# Patient Record
Sex: Male | Born: 1995 | Race: White | Hispanic: No | Marital: Married | Smoking: Never smoker
Health system: Southern US, Community
[De-identification: ages and names within clinical notes are randomized; demographics above are authoritative.]

## PROBLEM LIST (undated history)

## (undated) DIAGNOSIS — F988 Other specified behavioral and emotional disorders with onset usually occurring in childhood and adolescence: Secondary | ICD-10-CM

## (undated) DIAGNOSIS — R519 Headache, unspecified: Secondary | ICD-10-CM

## (undated) DIAGNOSIS — L409 Psoriasis, unspecified: Secondary | ICD-10-CM

## (undated) DIAGNOSIS — R51 Headache: Secondary | ICD-10-CM

## (undated) DIAGNOSIS — G4733 Obstructive sleep apnea (adult) (pediatric): Secondary | ICD-10-CM

## (undated) DIAGNOSIS — E782 Mixed hyperlipidemia: Secondary | ICD-10-CM

## (undated) DIAGNOSIS — R32 Unspecified urinary incontinence: Secondary | ICD-10-CM

## (undated) DIAGNOSIS — G8929 Other chronic pain: Secondary | ICD-10-CM

## (undated) HISTORY — DX: Obstructive sleep apnea (adult) (pediatric): G47.33

## (undated) HISTORY — DX: Other specified behavioral and emotional disorders with onset usually occurring in childhood and adolescence: F98.8

## (undated) HISTORY — DX: Unspecified urinary incontinence: R32

## (undated) HISTORY — DX: Psoriasis, unspecified: L40.9

## (undated) HISTORY — DX: Mixed hyperlipidemia: E78.2

---
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
## 2013-01-12 ENCOUNTER — Emergency Department (HOSPITAL_COMMUNITY): Payer: 59

## 2013-01-12 ENCOUNTER — Emergency Department (HOSPITAL_COMMUNITY)
Admission: EM | Admit: 2013-01-12 | Discharge: 2013-01-12 | Disposition: A | Discharge status: Home / Self Care | Payer: 59 | Attending: Emergency Medicine | Admitting: Emergency Medicine

## 2013-01-12 ENCOUNTER — Encounter (HOSPITAL_COMMUNITY): Payer: Self-pay | Admitting: *Deleted

## 2013-01-12 DIAGNOSIS — R1032 Left lower quadrant pain: Secondary | ICD-10-CM | POA: Insufficient documentation

## 2013-01-12 DIAGNOSIS — R112 Nausea with vomiting, unspecified: Secondary | ICD-10-CM | POA: Insufficient documentation

## 2013-01-12 DIAGNOSIS — R197 Diarrhea, unspecified: Secondary | ICD-10-CM | POA: Insufficient documentation

## 2013-01-12 DIAGNOSIS — R51 Headache: Secondary | ICD-10-CM | POA: Insufficient documentation

## 2013-01-12 LAB — COMPREHENSIVE METABOLIC PANEL
ALT: 50 U/L (ref 0–53)
AST: 25 U/L (ref 0–37)
Albumin: 4.3 g/dL (ref 3.5–5.2)
Alkaline Phosphatase: 69 U/L (ref 39–117)
BUN: 17 mg/dL (ref 6–23)
CO2: 29 mEq/L (ref 19–32)
Calcium: 9.8 mg/dL (ref 8.4–10.5)
Chloride: 100 mEq/L (ref 96–112)
Creatinine, Ser: 0.83 mg/dL (ref 0.50–1.35)
GFR calc Af Amer: >90 mL/min (ref >90)
GFR calc non Af Amer: >90 mL/min (ref >90)
Glucose, Bld: 95 mg/dL (ref 70–99)
Potassium: 4.4 mEq/L (ref 3.5–5.1)
Sodium: 138 mEq/L (ref 135–145)
Total Bilirubin: 1.6 mg/dL — ABNORMAL HIGH (ref 0.3–1.2)
Total Protein: 8 g/dL (ref 6.0–8.3)

## 2013-01-12 LAB — URINALYSIS, ROUTINE W REFLEX MICROSCOPIC
Bilirubin Urine: NEGATIVE
Glucose, UA: NEGATIVE mg/dL
Hgb urine dipstick: NEGATIVE
Ketones, ur: NEGATIVE mg/dL
Leukocytes, UA: NEGATIVE
Nitrite: NEGATIVE
Protein, ur: NEGATIVE mg/dL
Specific Gravity, Urine: 1.025 (ref 1.005–1.030)
Urobilinogen, UA: 0.2 mg/dL (ref 0.0–1.0)
pH: 6 (ref 5.0–8.0)

## 2013-01-12 LAB — CBC WITH DIFFERENTIAL/PLATELET
Basophils Absolute: 0 10*3/uL (ref 0.0–0.1)
Basophils Relative: 0 % (ref 0–1)
Eosinophils Absolute: 0.1 10*3/uL (ref 0.0–0.7)
Eosinophils Relative: 1 % (ref 0–5)
HCT: 41.7 % (ref 39.0–52.0)
Hemoglobin: 14.6 g/dL (ref 13.0–17.0)
Lymphocytes Relative: 17 % (ref 12–46)
Lymphs Abs: 2 10*3/uL (ref 0.7–4.0)
MCH: 26.9 pg (ref 26.0–34.0)
MCHC: 35 g/dL (ref 30.0–36.0)
MCV: 76.9 fL — ABNORMAL LOW (ref 78.0–100.0)
Monocytes Absolute: 0.9 10*3/uL (ref 0.1–1.0)
Monocytes Relative: 7 % (ref 3–12)
Neutro Abs: 9.4 10*3/uL — ABNORMAL HIGH (ref 1.7–7.7)
Neutrophils Relative %: 76 % (ref 43–77)
Platelets: 224 10*3/uL (ref 150–400)
RBC: 5.42 MIL/uL (ref 4.22–5.81)
RDW: 13.9 % (ref 11.5–15.5)
WBC: 12.4 10*3/uL — ABNORMAL HIGH (ref 4.0–10.5)

## 2013-01-12 MED ORDER — DIPHENHYDRAMINE HCL 50 MG/ML IJ SOLN
25.0000 mg | Freq: Once | INTRAMUSCULAR | Status: AC
  Administered 2013-01-12: 25 mg via INTRAVENOUS
  Filled 2013-01-12: qty 1

## 2013-01-12 MED ORDER — METOCLOPRAMIDE HCL 5 MG/ML IJ SOLN
10.0000 mg | Freq: Once | INTRAMUSCULAR | Status: AC
  Administered 2013-01-12: 10 mg via INTRAVENOUS
  Filled 2013-01-12: qty 2

## 2013-01-12 MED ORDER — SODIUM CHLORIDE 0.9 % IV SOLN
1000.0000 mL | INTRAVENOUS | Status: DC
  Administered 2013-01-12: 1000 mL via INTRAVENOUS

## 2013-01-12 MED ORDER — SODIUM CHLORIDE 0.9 % IV SOLN
1000.0000 mL | Freq: Once | INTRAVENOUS | Status: AC
  Administered 2013-01-12: 1000 mL via INTRAVENOUS

## 2013-01-12 MED ORDER — IOHEXOL 300 MG/ML  SOLN
100.0000 mL | Freq: Once | INTRAMUSCULAR | Status: AC | PRN
  Administered 2013-01-12: 100 mL via INTRAVENOUS

## 2013-01-12 MED ORDER — ONDANSETRON HCL 4 MG/2ML IJ SOLN
4.0000 mg | Freq: Once | INTRAMUSCULAR | Status: AC
  Administered 2013-01-12: 4 mg via INTRAVENOUS
  Filled 2013-01-12: qty 2

## 2013-01-12 MED ORDER — ONDANSETRON 8 MG PO TBDP: 8.0000 mg | ORAL_TABLET | Freq: Three times a day (TID) | ORAL | Status: DC | PRN

## 2013-01-12 MED ORDER — IOHEXOL 300 MG/ML  SOLN
50.0000 mL | Freq: Once | INTRAMUSCULAR | Status: AC | PRN
  Administered 2013-01-12: 50 mL via ORAL

## 2013-01-12 NOTE — ED Notes (Signed)
Nausea, vomiting and diarrhea for the past 2 days

## 2013-01-12 NOTE — ED Provider Notes (Signed)
History  This chart was scribed for Ward Givens, MD by Bennett Scrape, ED Scribe. This patient was seen in room APA12/[facility]12 and the patient's care was started at 3:28 PM.  CSN: [id number]  Arrival date & time 01/12/13  1404   First MD Initiated Contact with Patient 01/12/13 1528      Chief Complaint  Patient presents with  . Abdominal Pain    Patient is a 17 y.o. male presenting with abdominal pain. The history is provided by the patient. No language interpreter was used.  Abdominal Pain Pain location:  LLQ Pain quality: sharp   Pain radiates to:  Does not radiate Onset quality:  Gradual Duration:  2 days Timing:  Constant Progression:  Worsening Chronicity:  New Context: recent illness   Relieved by:  Nothing Worsened by:  Movement Ineffective treatments:  None tried Associated symptoms: diarrhea, nausea and vomiting   Associated symptoms: no chills and no fever     Ryan Carey is a 17 y.o. male who presents to the Emergency Department complaining of 2 days of gradual onset, gradually worsening, constant LLQ abdominal pain described as sharp with associated nausea, 2 episodes of emesis yesterday, 4 episodes of diarrhea today and HA at the bilateral temples. The pain is worse with lifting, excessive movement and occasionally ambulation. He denies having any sick contacts with similar symptoms. He denies fever, chills, but does have lightheadedness, weakness and dizziness as associated symptoms. He does not have a h/o chronic medical conditions. He does dip but denies smoking and alcohol. 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 No pertinent past medical history.  History reviewed. No pertinent past surgical history.  No family history on file.  History  Substance Use Topics  . Smoking status: Not on file  . Smokeless tobacco: Current User    Types: Snuff  . 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    Review of Systems  Constitutional: Negative for fever and chills.  Gastrointestinal: Positive for nausea, vomiting, abdominal pain and diarrhea. Negative for blood in stool.  Neurological: Positive for headaches. Negative for dizziness and light-headedness.  All other systems reviewed and are negative.    Allergies  Review of patient's allergies indicates no known allergies.  Home Medications  No current outpatient prescriptions on file.  Triage Vitals: BP 146/79  Pulse 93  Temp(Src) 98.1 F (36.7 C) (Oral)  Resp 20  Ht 5\' 10"  (1.778 m)  Wt 232 lb (105.235 kg)  BMI 33.29 kg/m2  SpO2 96%  Vital signs normal    Physical Exam  Nursing note and vitals reviewed. Constitutional: He is oriented to person, place, and time. He appears well-developed and well-nourished.  Non-toxic appearance. He does not appear ill. No distress.  HENT:  Head: Normocephalic and atraumatic.  Right Ear: External ear normal.  Left Ear: External ear normal.  Nose: Nose normal. No mucosal edema or rhinorrhea.  Mouth/Throat: Oropharynx is clear and moist and mucous membranes are normal. No dental abscesses or edematous.  Eyes: Conjunctivae and EOM are normal. Pupils are equal, round, and reactive to light.  Neck: Normal range of motion and full passive range of motion without pain. Neck supple.  Cardiovascular: Normal rate, regular rhythm and normal heart sounds.  Exam reveals no gallop and no friction rub.   No murmur heard. Pulmonary/Chest: Effort normal and breath sounds  normal. No respiratory distress. He has no wheezes. He has no rhonchi. He has no rales. He exhibits no tenderness and no crepitus.  Abdominal: Soft. Normal appearance and bowel sounds are normal. He exhibits no distension. There is tenderness (diffusely in the lower abdomen, most tender in the LLQ). There is no rebound and no guarding.  Decreased bowel sounds  Musculoskeletal: Normal  range of motion. He exhibits no edema and no tenderness.  Moves all extremities well.   Neurological: He is alert and oriented to person, place, and time. He has normal strength. No cranial nerve deficit.  Skin: Skin is warm, dry and intact. No rash noted. No erythema. There is pallor.  Psychiatric: He has a normal mood and affect. His speech is normal and behavior is normal. His mood appears not anxious.    ED Course  Procedures (including critical care time)  Medications  0.9 %  sodium chloride infusion (1,000 mLs Intravenous New Bag/Given 01/12/13 1602)    Followed by  0.9 %  sodium chloride infusion (1,000 mLs Intravenous New Bag/Given 01/12/13 1603)    Followed by  0.9 %  sodium chloride infusion (1,000 mLs Intravenous New Bag/Given 01/12/13 1603)  ondansetron (ZOFRAN) injection 4 mg (4 mg Intravenous Given 01/12/13 1606)  metoCLOPramide (REGLAN) injection 10 mg (10 mg Intravenous Given 01/12/13 1607)  diphenhydrAMINE (BENADRYL) injection 25 mg (25 mg Intravenous Given 01/12/13 1608)  iohexol (OMNIPAQUE) 300 MG/ML solution 50 mL (50 mLs Oral Contrast Given 01/12/13 1610)  iohexol (OMNIPAQUE) 300 MG/ML solution 100 mL (100 mLs Intravenous Contrast Given 01/12/13 1630)    DIAGNOSTIC STUDIES: Oxygen Saturation is 96% on room air, adequate by my interpretation.    COORDINATION OF CARE: 3:39 PM-Discussed treatment plan which includes medications, CT of abdomen, CBC panel and UA with pt at bedside and pt agreed to plan. Pt is requesting a work note for today and yesterday.   17:00 Pt feeling better, still pale, nausea gone, willing to try drinking fluids.  19:00 has had Urine output, drinking fluids, had one diarrheal stool.   Results for orders placed during the hospital encounter of 01/12/13  CBC WITH DIFFERENTIAL      Result Value Range   WBC 12.4 (*) 4.0 - 10.5 K/uL   RBC 5.42  4.22 - 5.81 MIL/uL   Hemoglobin 14.6  13.0 - 17.0 g/dL   HCT 40.9  81.1 - 91.4 %   MCV 76.9 (*) 78.0 -  100.0 fL   MCH 26.9  26.0 - 34.0 pg   MCHC 35.0  30.0 - 36.0 g/dL   RDW 78.2  95.6 - 21.3 %   Platelets 224  150 - 400 K/uL   Neutrophils Relative 76  43 - 77 %   Neutro Abs 9.4 (*) 1.7 - 7.7 K/uL   Lymphocytes Relative 17  12 - 46 %   Lymphs Abs 2.0  0.7 - 4.0 K/uL   Monocytes Relative 7  3 - 12 %   Monocytes Absolute 0.9  0.1 - 1.0 K/uL   Eosinophils Relative 1  0 - 5 %   Eosinophils Absolute 0.1  0.0 - 0.7 K/uL   Basophils Relative 0  0 - 1 %   Basophils Absolute 0.0  0.0 - 0.1 K/uL  COMPREHENSIVE METABOLIC PANEL      Result Value Range   Sodium 138  135 - 145 mEq/L   Potassium 4.4  3.5 - 5.1 mEq/L   Chloride 100  96 - 112 mEq/L   CO2  29  19 - 32 mEq/L   Glucose, Bld 95  70 - 99 mg/dL   BUN 17  6 - 23 mg/dL   Creatinine, Ser 4.54  0.50 - 1.35 mg/dL   Calcium 9.8  8.4 - 09.8 mg/dL   Total Protein 8.0  6.0 - 8.3 g/dL   Albumin 4.3  3.5 - 5.2 g/dL   AST 25  0 - 37 U/L   ALT 50  0 - 53 U/L   Alkaline Phosphatase 69  39 - 117 U/L   Total Bilirubin 1.6 (*) 0.3 - 1.2 mg/dL   GFR calc non Af Amer >90  >90 mL/min   GFR calc Af Amer >90  >90 mL/min  URINALYSIS, ROUTINE W REFLEX MICROSCOPIC      Result Value Range   Color, Urine YELLOW  YELLOW   APPearance CLEAR  CLEAR   Specific Gravity, Urine 1.025  1.005 - 1.030   pH 6.0  5.0 - 8.0   Glucose, UA NEGATIVE  NEGATIVE mg/dL   Hgb urine dipstick NEGATIVE  NEGATIVE   Bilirubin Urine NEGATIVE  NEGATIVE   Ketones, ur NEGATIVE  NEGATIVE mg/dL   Protein, ur NEGATIVE  NEGATIVE mg/dL   Urobilinogen, UA 0.2  0.0 - 1.0 mg/dL   Nitrite NEGATIVE  NEGATIVE   Leukocytes, UA NEGATIVE  NEGATIVE    Laboratory interpretation all normal except mild leukocytosis  Ct Abdomen Pelvis W Contrast  01/12/2013  *RADIOLOGY REPORT*  Clinical Data: Left lower quadrant pain, nausea  CT ABDOMEN AND PELVIS WITH CONTRAST  Technique:  Multidetector CT imaging of the abdomen and pelvis was performed following the standard protocol during bolus administration  of intravenous contrast.  Contrast: 50mL OMNIPAQUE IOHEXOL 300 MG/ML  SOLN, OMNIPAQUE IOHEXOL 300 MG/ML  SOLN  Comparison: None.  Findings: Small pleural based nodule in the right middle lobe measuring 5 mm appears benign (image #1).  No pericardial fluid.  No focal hepatic lesion.  The gallbladder, pancreas, spleen, adrenal glands, and kidneys are normal.  The stomach, small bowel, appendix, and cecum are normal.  Colon and rectosigmoid colon are normal.  Abdominal aorta normal caliber.  No retroperitoneal or periportal lymphadenopathy.  No free fluid the pelvis.  No pelvic lymphadenopathy.  The bladder and prostate gland are normal.  No pelvic adenopathy.  No evidence of inguinal or ventral hernia.  Review of the bone windows demonstrates no acute findings.  There are bilateral pars defects at L5 without  anterolisthesis.  IMPRESSION:  1.  No acute abdominal or pelvic findings. 2.  Normal appendix. 3.  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   Original Report Authenticated By: Genevive Bi, M.D.       1. Nausea vomiting and diarrhea   2. LLQ abdominal pain   3. Headache    New Prescriptions   ONDANSETRON (ZOFRAN ODT) 8 MG DISINTEGRATING TABLET    Take 1 tablet (8 mg total) by mouth every 8 (eight) hours as needed for nausea.    Plan discharge  Devoria Albe, MD, FACEP    MDM    I personally performed the services described in this documentation, which was scribed in my presence. The recorded information has been reviewed and considered.  Devoria Albe, MD, Armando Gang        Ward Givens, MD 01/12/13 [phone]

## 2013-05-31 ENCOUNTER — Ambulatory Visit (INDEPENDENT_AMBULATORY_CARE_PROVIDER_SITE_OTHER): Payer: 59 | Admitting: Family Medicine

## 2013-05-31 ENCOUNTER — Encounter: Payer: Self-pay | Admitting: Family Medicine

## 2013-05-31 VITALS — BP 122/88 | Temp 98.7°F | Ht 68.5 in | Wt 225.0 lb

## 2013-05-31 DIAGNOSIS — J020 Streptococcal pharyngitis: Secondary | ICD-10-CM

## 2013-05-31 DIAGNOSIS — J029 Acute pharyngitis, unspecified: Secondary | ICD-10-CM

## 2013-05-31 LAB — POCT RAPID STREP A (OFFICE): Rapid Strep A Screen: POSITIVE — AB

## 2013-05-31 MED ORDER — CEFTRIAXONE SODIUM 1 G IJ SOLR
500.0000 mg | Freq: Once | INTRAMUSCULAR | Status: AC
  Administered 2013-05-31: 500 mg via INTRAMUSCULAR

## 2013-05-31 MED ORDER — AMOXICILLIN 500 MG PO TABS: 500.0000 mg | ORAL_TABLET | Freq: Three times a day (TID) | ORAL | Status: DC

## 2013-05-31 MED ORDER — HYDROCODONE-HOMATROPINE 5-1.5 MG/5ML PO SYRP: 5.0000 mL | ORAL_SOLUTION | Freq: Four times a day (QID) | ORAL | Status: DC | PRN

## 2013-05-31 NOTE — Progress Notes (Signed)
  Subjective:    Patient ID: Ryan Carey, male    DOB: 1995-11-23, 17 y.o.   MRN: [id number]  Fever  This is a new problem. The current episode started yesterday. The maximum temperature noted was 100 to 100.9 F. Associated symptoms include headaches. Associated symptoms comments: Body aches. He has tried acetaminophen for the symptoms. The treatment provided mild relief.   Patient also relates sore throat denies high fever chills states it more so low-grade temperature and discomforts PMH benign   Review of Systems  Constitutional: Positive for fever.  Neurological: Positive for headaches.  thrpoat pain Poor energy Nausea/ no rashes No exposure     Objective:   Physical Exam Neck is supple lungs are clear hearts regular Throat ear edematous with enlarged tonsils with worse on the right but prominent on both sides patient does not appear toxic but he does look like he doesn't feel well      Assessment & Plan:  Positive strep test Strep pharyngitis-Rocephin shot, amoxicillin 3 times a day, Hycodan teaspoon every 4 hours when necessary pain cautioned drowsiness, warning signs for tonsillar abscess discuss followup if

## 2013-07-19 ENCOUNTER — Telehealth: Payer: Self-pay | Admitting: Family Medicine

## 2013-07-19 NOTE — Telephone Encounter (Signed)
Please move his appointment to Tuesday.

## 2013-07-19 NOTE — Telephone Encounter (Signed)
Appointment made for 07/20/2013 @ 9:30am
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
## 2013-07-20 ENCOUNTER — Ambulatory Visit (INDEPENDENT_AMBULATORY_CARE_PROVIDER_SITE_OTHER): Payer: 59 | Admitting: Family Medicine

## 2013-07-20 ENCOUNTER — Encounter: Payer: Self-pay | Admitting: Family Medicine

## 2013-07-20 VITALS — BP 132/88 | Ht 69.0 in | Wt 226.2 lb

## 2013-07-20 DIAGNOSIS — R51 Headache: Secondary | ICD-10-CM

## 2013-07-20 DIAGNOSIS — Z029 Encounter for administrative examinations, unspecified: Secondary | ICD-10-CM
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
## 2013-07-22 ENCOUNTER — Ambulatory Visit: Payer: 59 | Admitting: Family Medicine

## 2013-07-26 DIAGNOSIS — Z0289 Encounter for other administrative examinations: Secondary | ICD-10-CM

## 2013-10-09 ENCOUNTER — Encounter: Payer: Self-pay | Admitting: *Deleted

## 2013-11-10 ENCOUNTER — Encounter (HOSPITAL_COMMUNITY): Payer: Self-pay | Admitting: Emergency Medicine

## 2013-11-10 ENCOUNTER — Emergency Department (HOSPITAL_COMMUNITY)
Admission: EM | Admit: 2013-11-10 | Discharge: 2013-11-10 | Disposition: A | Discharge status: Home / Self Care | Payer: 59 | Attending: Emergency Medicine | Admitting: Emergency Medicine

## 2013-11-10 ENCOUNTER — Emergency Department (HOSPITAL_COMMUNITY): Payer: 59

## 2013-11-10 DIAGNOSIS — R52 Pain, unspecified: Secondary | ICD-10-CM | POA: Insufficient documentation

## 2013-11-10 DIAGNOSIS — J020 Streptococcal pharyngitis: Secondary | ICD-10-CM | POA: Insufficient documentation

## 2013-11-10 DIAGNOSIS — J189 Pneumonia, unspecified organism: Secondary | ICD-10-CM

## 2013-11-10 DIAGNOSIS — Z79899 Other long term (current) drug therapy: Secondary | ICD-10-CM | POA: Insufficient documentation

## 2013-11-10 DIAGNOSIS — J159 Unspecified bacterial pneumonia: Secondary | ICD-10-CM | POA: Insufficient documentation

## 2013-11-10 DIAGNOSIS — G8929 Other chronic pain: Secondary | ICD-10-CM | POA: Insufficient documentation

## 2013-11-10 HISTORY — DX: Headache: R51

## 2013-11-10 HISTORY — DX: Headache, unspecified: R51.9

## 2013-11-10 HISTORY — DX: Other chronic pain: G89.29

## 2013-11-10 LAB — RAPID STREP SCREEN (MED CTR MEBANE ONLY): Streptococcus, Group A Screen (Direct): POSITIVE — AB

## 2013-11-10 MED ORDER — PENICILLIN G BENZATHINE 1200000 UNIT/2ML IM SUSP
1.2000 10*6.[IU] | Freq: Once | INTRAMUSCULAR | Status: AC
  Administered 2013-11-10: 1.2 10*6.[IU] via INTRAMUSCULAR
  Filled 2013-11-10: qty 2

## 2013-11-10 MED ORDER — LEVOFLOXACIN 750 MG PO TABS: 750.0000 mg | ORAL_TABLET | Freq: Every day | ORAL | Status: DC

## 2013-11-10 MED ORDER — LEVOFLOXACIN 750 MG PO TABS
750.0000 mg | ORAL_TABLET | Freq: Once | ORAL | Status: AC
  Administered 2013-11-10: 750 mg via ORAL

## 2013-11-10 MED ORDER — ACETAMINOPHEN 500 MG PO TABS
1000.0000 mg | ORAL_TABLET | Freq: Once | ORAL | Status: AC
  Administered 2013-11-10: 1000 mg via ORAL
  Filled 2013-11-10: qty 2

## 2013-11-10 NOTE — Discharge Instructions (Signed)
°Emergency Department Resource Guide °1) Find a Doctor and Pay Out of Pocket °Although you won't have to find out who is covered by your insurance plan, it is a good idea to ask around and get recommendations. You will then need to call the office and see if the doctor you have chosen will accept you as a new patient and what types of options they offer for patients who are self-pay. Some doctors offer discounts or will set up payment plans for their patients who do not have insurance, but you will need to ask so you aren't surprised when you get to your appointment. ° °2) Contact Your Local Health Department °Not all health departments have doctors that can see patients for sick visits, but many do, so it is worth a call to see if yours does. If you don't know where your local health department is, you can check in your phone book. The CDC also has a tool to help you locate your state's health department, and many state websites also have listings of all of their local health departments. ° °3) Find a Walk-in Clinic °If your illness is not likely to be very severe or complicated, you may want to try a walk in clinic. These are popping up all over the country in pharmacies, drugstores, and shopping centers. They're usually staffed by nurse practitioners or physician assistants that have been trained to treat common illnesses and complaints. They're usually fairly quick and inexpensive. However, if you have serious medical issues or chronic medical problems, these are probably not your best option. ° °No Primary Care Doctor: °- Call [facility] at  832-8000 - they can help you locate a primary care doctor that  accepts your insurance, provides certain services, etc. °- Physician Referral Service- 1-800-533-3463 ° °Chronic Pain Problems: °Organization         Address  Phone   Notes  °[facility]  (336) 297-2271 Patients need to be referred by their primary care doctor.  ° °Medication  Assistance: °Organization         Address  Phone   Notes  °[place] [facility] [address] °[place], [place] [address] (336) 641-8030 --Must be a resident of [place] °-- Must have NO insurance coverage whatsoever (no Medicaid/ Medicare, etc.) °-- The pt. MUST have a primary care doctor that directs their care regularly and follows them in the community °  °[facility]  (866) 331-1348   °[employer]  (888) 892-1162   ° °Agencies that provide inexpensive medical care: °Organization         Address  Phone   Notes  °[facility]  (336) 832-8035   °[facility]    (336) 832-7272   °[facility] [address] °[place], [place] [address] (336) 832-4777   °[facility] [address], °[place] (336) 271-4999   °[facility]    (336) 373-0678   °[facility]    (336) 272-1050   °[facility] ° [address], [place] Phone:  (336) 832-4444, Fax:  (336) 832-4440 Hours of Operation:  9 am - 6 pm, M-F.  Also accepts Medicaid/Medicare and self-pay.  °[facility] ° [address], [place] Phone: (336) 832-3150, Fax: (336) 832-3151. Hours of Operation:  8:30 am - 5:30 pm, M-F.  Also accepts Medicaid and self-pay.  °[facility] [place] [address], [place] Phone: (336) 878-6027   °[facility] [address], [place], [place] (336)723-1848, Ext. 123 Mondays & Thursdays: 7-9 AM.  First 15 patients are seen on a first come, first serve basis. °  ° °Medicaid-accepting [place] Providers: ° °Organization         Address  Phone   Notes  °[facility] [address], [place] (336) 641-2100 Also accepts self-pay patients.  °[facility] [address], [place] ° (336) 856-9996   °[facility] [address], [place]  (336) 288-8857   °[facility] [address] [place] [address], [place] (336) 299-7000   °Veita Bland [address], [place]  ° (336) 373-1557 Only accepts [place] Access Medicaid patients after they have their name applied to their card.  ° °Self-Pay (no insurance) in [place]: ° °Organization         Address  Phone   Notes  °Sickle Cell Patients, [facility] [address], [place] (336) 832-1970   °[facility] [address], [place] (336) 832-4400   °[facility] ° [address], [place] (336) 992-4800   °[facility]/Dr. Osei-Bonsu ° [address] [place] [address], [place] or [address], [place] (336) [phone] Phone number for both [place] and [place] locations is the same.  °[facility] [address], [place] (336) 299-0000   °[facility] [address] [place] [address], [place] or [address] (336) 852-7530 °(336) 878-2260   °[facility] [address], [place] (336) 350-1642, phone; (336) 294-5005, fax Sees patients 1st and 3rd Saturday of every month.  Must not qualify for public or private insurance (i.e. Medicaid, Medicare, [place] Health Choice, Veterans' Benefits) • Household income should be no more than 200% of the poverty level •The clinic cannot treat you if you are pregnant or think you are pregnant • Sexually transmitted diseases are not treated at the clinic.  ° ° °Dental Care: °Organization         Address  Phone  Notes  °[place] [facility] [address], [place] (336) 641-6152 Accepts children up to age 21 who are enrolled in Medicaid or [place] Health Choice; pregnant women with a Medicaid card; and children who have applied for Medicaid or [place] Health Choice, but were declined, whose parents can pay a reduced fee at time of service.  °[place]  [facility] [place]  [address], [place] (336) 641-7733 Accepts children up to age 21 who are enrolled in Medicaid or [place] Health Choice; pregnant women with a Medicaid card; and children who have applied for Medicaid or [place] Health Choice, but were declined, whose parents can pay a reduced fee at time of service.  °[facility] ° [address], [place] (336) 641-4533 Patients are seen by appointment only. Walk-ins are not accepted. [facility] will see patients 18 years of age and older. °Monday - Tuesday (8am-5pm) °Most Wednesdays (8:30-5pm) °$30 per visit, cash only  °[facility] ° [address], [place] (336) 641-4533 Patients are seen by appointment only. Walk-ins are not accepted. [facility] will see patients 18 years of age and older. °One   Wednesday Evening (Monthly: Volunteer Based).  $30 per visit, cash only  °[facility]  (919) 537-3737 for adults; Children under age 4, call Graduate Pediatric Dentistry at ([phone]) [phone]. Children aged 4-14, please call (919) [phone] to request a pediatric application. ° Dental services are provided in all areas of dental care including fillings, crowns and bridges, complete and partial dentures, implants, gum treatment, root canals, and extractions. Preventive care is also provided. Treatment is provided to both adults and children. °Patients are selected via a lottery and there is often a waiting list. °  °[facility] [address], °[place] ° (336) 763-8833 [email or web address].drcivils.[email or web address] °  °[facility] [address], [place], [place] (336)723-1848, Ext. 123 Second and Fourth Thursday of each month, opens at 6:30 AM; Clinic ends at 9 AM.  Patients are seen on a first-come first-served basis, and a limited number are seen during each clinic.  ° °[facility] ° [address], [place], [place] (336) 723-7904    Eligibility Requirements °You must have lived in [place], [place], or [place] counties for at least the last three months. °  You cannot be eligible for state or federal sponsored healthcare insurance, including Veterans Administration, Medicaid, or Medicare. °  You generally cannot be eligible for healthcare insurance through your employer.  °  How to apply: °Eligibility screenings are held every Tuesday and Wednesday afternoon from 1:00 pm until 4:00 pm. You do not need an appointment for the interview!  °[facility] [address], [place], [place] 336-631-2330   °[facility]  336-342-8273   °[facility]  336-703-3100   °[facility]  336-570-6415   ° °Behavioral Health Resources in the Community: °Intensive Outpatient Programs °Organization         Address  Phone  Notes  °[facility] [address], [place], [place] 336-878-6098   °[facility] [address], [place], [place] 336-832-9800   °[facility] [address], [place], [place] ° 336-882-2125   °[facility] [address],  °[place], [place] 1-800-853-5163 or 336-641-4981   °Substance Abuse Resources °Organization         Address  Phone  Notes  °[facility]  336-882-2125   °[facility]  336-784-9470   °[facility]  336-285-9073   °[facility]  336-845-3988   °Residential & Outpatient Substance Abuse Program  1-800-659-3381   °Psychological Services °Organization         Address  Phone  Notes  °[facility]  336- 832-9600   °[facility]  336- 378-7881   °[facility] [address], [place] 1-800-853-5163 or 336-641-4981   ° °Mobile Crisis Teams °Organization         Address  Phone  Notes  °[facility], Mobile Crisis Care Unit  1-877-626-1772   °Assertive °[facility] ° [address]  [place], [place] 336-834-9664   °Sharon DeEsch [address] °[place]  336-554-5454   ° °Self-Help/Support Groups °Organization         Address  Phone             Notes  °[facility] [place] - variety of support groups  336- 373-1402 Call for more information  °Narcotics Anonymous (NA), [facility] [address], °[place]   2 meetings at this location  ° °  Residential Treatment Programs Organization         Address  Phone  Notes  [facility] [address],    [place]  1-(251)192-6188   [facility]  [address] [place] [address], [place], [place]   [place], [place] (859)129-2099 Admissions: 8am-3pm M-F  [facility] [place] [address],    [facility] [place], [place] 384-665-9935   [facility] [address] [place], [place], [place]   [facility] [address]  [place], [place]   [facility] Intensive Outpatient [address], [place], [place]   [facility] [address]  [place], [place] 1-(856) 446-4762 or 850-834-4193   [facility] [address], [place], [place] Accepts Medicaid  [facility] [address],  [place] [phone] Substance Abuse/Addiction Treatment   [facility]         Address  Phone  Notes  [facility]  902-392-2796   Domenic Schwab, PhD [address] Arlis [address]a [place], [place]   910-271-3060 or 934-518-5525   [facility], [place] 769-640-4103   [facility] [address], [place], [place] (410)[phone] Insurance/Medicaid/sponsorship through [facility] [address]                                    [place], [place] 418-437-3498 [place] [address] [place], [place] [phone]    Dr. Adele Schilder  [phone]   [facility] Dept. 1) [address] [place] 2) [place] 3)  [place] [address], [place] [phone] [phone]  [phone]   [place] 513-875-4783 or [phone] (After Hours)       Take over the counter tylenol and ibuprofen, as directed on packaging, as needed for discomfort.  Gargle with warm water several times per day to help with discomfort.  May also use over the counter sore throat pain medicines such as chloraseptic or sucrets, as directed on packaging, as needed for discomfort. You were given an injection of penicillin while you were in the Emergency Department; this will fully treat your "strep throat." Take the prescription as directed. Call your regular medical doctor today to schedule a follow up appointment in the next 2 days.  Return to the Emergency Department immediately if worsening.

## 2013-11-10 NOTE — ED Notes (Signed)
Pt c/o chills, fever, and body aches since yesterday.

## 2013-11-10 NOTE — ED Provider Notes (Signed)
CSN: [id number]     Arrival date & time 11/10/13  0018 History   First MD Initiated Contact with Patient 11/10/13 0034     Chief Complaint  Patient presents with  . Chills  . Fever  . Generalized Body Aches    HPI Pt was seen at [facility].  Per pt, c/o gradual onset and persistence of constant sore throat, runny/stuffy nose, sinus congestion, generalized body aches/fatigue, and cough for the past 2 days. Has been associated with home fevers to "101." Pt took motrin PTA.  Denies rash, no CP/SOB, no N/V/D, no abd pain.     Past Medical History  Diagnosis Date  . Enuresis   . Chronic headaches    History reviewed. No pertinent past surgical history.  History  Substance Use Topics  . Smoking status: Never Smoker   . Smokeless tobacco: Current User    Types: Snuff  . Alcohol Use: No    Review of Systems ROS: Statement: All systems negative except as marked or noted in the HPI; Constitutional: +fever and chills, generalized body aches/fatigue. ; ; Eyes: Negative for eye pain, redness and discharge. ; ; ENMT: Negative for ear pain, hoarseness, +nasal congestion, sinus pressure and sore throat. ; ; Cardiovascular: Negative for chest pain, palpitations, diaphoresis, dyspnea and peripheral edema. ; ; Respiratory: +cough. Negative for wheezing and stridor. ; ; Gastrointestinal: Negative for nausea, vomiting, diarrhea, abdominal pain, blood in stool, hematemesis, jaundice and rectal bleeding. . ; ; Genitourinary: Negative for dysuria, flank pain and hematuria. ; ; Musculoskeletal: Negative for back pain and neck pain. Negative for swelling and trauma.; ; Skin: Negative for pruritus, rash, abrasions, blisters, bruising and skin lesion.; ; Neuro: Negative for headache, lightheadedness and neck stiffness. Negative for weakness, altered level of consciousness , altered mental status, extremity weakness, paresthesias, involuntary movement, seizure and syncope.       Allergies  Codeine  Home  Medications   Current Outpatient Rx  Name  Route  Sig  Dispense  Refill  . bisacodyl (LAXATIVE) 10 MG suppository      10 mg.         . enoxaparin (LOVENOX) 30 MG/0.3ML injection      30 mg.         . gabapentin (NEURONTIN) 100 MG capsule      200 mg. Take 2 capsules (200 mg total) by mouth 3 times daily.         . ondansetron (ZOFRAN) 4 MG/2ML SOLN injection      4 mg.         . oxyCODONE (OXY IR/ROXICODONE) 5 MG immediate release tablet      5 mg. Take 1 tablet (5 mg total) by mouth every 4 (four) hours as needed.         . sodium chloride 1 G tablet      1,000 mg. Take 1 tablet (1,000 mg total) by mouth 3 times daily.          BP 139/82  Pulse 125  Temp(Src) 100 F (37.8 C) (Oral)  Resp 18  Wt 226 lb (102.513 kg)  SpO2 93% BP 125/64  Pulse 115  Temp(Src) 100 F (37.8 C) (Oral)  Resp 18  Wt 226 lb (102.513 kg)  SpO2 96%  Physical Exam 0110: Physical examination:  Nursing notes reviewed; Vital signs and O2 SAT reviewed;  Constitutional: Well developed, Well nourished, Well hydrated, In no acute distress; Head:  Normocephalic, atraumatic; Eyes: EOMI, PERRL, No scleral icterus; ENMT: TM's  clear bilat. +edemetous nasal turbinates bilat with clear rhinorrhea. Mouth and pharynx without lesions. +mild posterior pharyngeal erythema. No tonsillar exudates. No intra-oral edema. No submandibular or sublingual edema. No hoarse voice, no drooling, no stridor. No pain with manipulation of larynx. Mucous membranes moist; Neck: Supple, Full range of motion, No lymphadenopathy; Cardiovascular: Tachycardic rate and rhythm, No murmur, rub, or gallop; Respiratory: Breath sounds coarse & equal bilaterally, No wheezes.  Speaking full sentences with ease, Normal respiratory effort/excursion; Chest: Nontender, Movement normal; Abdomen: Soft, Nontender, Nondistended, Normal bowel sounds; Genitourinary: No CVA tenderness; Extremities: Pulses normal, No tenderness, No edema, No calf  edema or asymmetry.; Neuro: AA&Ox3, Major CN grossly intact.  Speech clear. No gross focal motor or sensory deficits in extremities.; Skin: Color normal, Warm, Dry.   ED Course  Procedures   EKG Interpretation   None       MDM  MDM Reviewed: previous chart, nursing note and vitals Interpretation: labs and x-ray     Results for orders placed during the hospital encounter of 11/10/13  RAPID STREP SCREEN      Result Value Range   Streptococcus, Group A Screen (Direct) POSITIVE (*) NEGATIVE   Dg Chest 2 View 11/10/2013   CLINICAL DATA:  Cough.  Chills.  Fever.  Body aches.  EXAM: CHEST  2 VIEW  COMPARISON:  None.  FINDINGS: Suboptimal inspiration accounts for crowded bronchovascular markings diffusely and atelectasis in the bases, and accentuates the cardiac silhouette. Taking this into account, cardiomediastinal silhouette unremarkable. Patchy airspace opacities in the right lower lobe posteriorly, superimposed upon the atelectasis. Upper lobes clear. Pulmonary vascularity normal. No pleural effusions. Visualized bony thorax intact.  IMPRESSION: Suboptimal inspiration accounts for atelectasis in the lower lobes. Superimposed acute pneumonia in the right middle and lower lobes.   Electronically Signed   By: Evangeline Dakin M.D.   On: 11/10/2013 01:50    0300:  Feels improved after meds and wants to go home now. Will tx for strep throat with IM PCN. Will tx with levaquin for CAP. Dx and testing d/w pt and family.  Questions answered.  Verb understanding, agreeable to d/c home with outpt f/u.   Alfonzo Feller, DO 11/12/13 2344

## 2013-11-17 ENCOUNTER — Ambulatory Visit: Payer: 59 | Admitting: Family Medicine

## 2015-05-04 ENCOUNTER — Emergency Department (HOSPITAL_COMMUNITY)
Admission: EM | Admit: 2015-05-04 | Discharge: 2015-05-04 | Disposition: A | Discharge status: Home / Self Care | Payer: BLUE CROSS/BLUE SHIELD | Attending: Emergency Medicine | Admitting: Emergency Medicine

## 2015-05-04 ENCOUNTER — Encounter (HOSPITAL_COMMUNITY): Payer: Self-pay

## 2015-05-04 ENCOUNTER — Emergency Department (HOSPITAL_COMMUNITY): Payer: BLUE CROSS/BLUE SHIELD

## 2015-05-04 DIAGNOSIS — Y92410 Unspecified street and highway as the place of occurrence of the external cause: Secondary | ICD-10-CM | POA: Insufficient documentation

## 2015-05-04 DIAGNOSIS — Y998 Other external cause status: Secondary | ICD-10-CM | POA: Insufficient documentation

## 2015-05-04 DIAGNOSIS — S29092A Other injury of muscle and tendon of back wall of thorax, initial encounter: Secondary | ICD-10-CM | POA: Diagnosis not present

## 2015-05-04 DIAGNOSIS — Y9389 Activity, other specified: Secondary | ICD-10-CM | POA: Diagnosis not present

## 2015-05-04 DIAGNOSIS — S39012A Strain of muscle, fascia and tendon of lower back, initial encounter: Secondary | ICD-10-CM | POA: Insufficient documentation

## 2015-05-04 DIAGNOSIS — G8929 Other chronic pain: Secondary | ICD-10-CM | POA: Diagnosis not present

## 2015-05-04 DIAGNOSIS — S3992XA Unspecified injury of lower back, initial encounter: Secondary | ICD-10-CM | POA: Diagnosis present

## 2015-05-04 MED ORDER — ONDANSETRON HCL 4 MG/2ML IJ SOLN
4.0000 mg | Freq: Once | INTRAMUSCULAR | Status: AC
  Administered 2015-05-04: 4 mg via INTRAVENOUS
  Filled 2015-05-04: qty 2

## 2015-05-04 MED ORDER — HYDROCODONE-ACETAMINOPHEN 5-325 MG PO TABS: 2.0000 | ORAL_TABLET | ORAL | Status: DC | PRN

## 2015-05-04 MED ORDER — NAPROXEN 500 MG PO TABS: 500.0000 mg | ORAL_TABLET | Freq: Two times a day (BID) | ORAL | Status: DC

## 2015-05-04 MED ORDER — OXYCODONE-ACETAMINOPHEN 5-325 MG PO TABS
2.0000 | ORAL_TABLET | Freq: Once | ORAL | Status: AC
  Administered 2015-05-04: 2 via ORAL
  Filled 2015-05-04: qty 2

## 2015-05-04 MED ORDER — METHOCARBAMOL 500 MG PO TABS: 500.0000 mg | ORAL_TABLET | Freq: Two times a day (BID) | ORAL | Status: DC
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
## 2015-05-04 NOTE — Discharge Instructions (Signed)
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
## 2015-05-04 NOTE — ED Notes (Signed)
Patient transported to CT 

## 2015-05-19 ENCOUNTER — Ambulatory Visit (INDEPENDENT_AMBULATORY_CARE_PROVIDER_SITE_OTHER): Payer: BLUE CROSS/BLUE SHIELD | Admitting: Family Medicine

## 2015-05-19 ENCOUNTER — Other Ambulatory Visit: Payer: Self-pay | Admitting: *Deleted

## 2015-05-19 ENCOUNTER — Encounter: Payer: Self-pay | Admitting: Family Medicine

## 2015-05-19 VITALS — BP 110/78 | Ht 71.0 in | Wt 229.0 lb

## 2015-05-19 DIAGNOSIS — S39012D Strain of muscle, fascia and tendon of lower back, subsequent encounter: Secondary | ICD-10-CM | POA: Diagnosis not present

## 2015-05-19 MED ORDER — NAPROXEN 500 MG PO TABS: 500.0000 mg | ORAL_TABLET | Freq: Two times a day (BID) | ORAL | Status: DC
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
## 2015-06-12 ENCOUNTER — Ambulatory Visit: Payer: BLUE CROSS/BLUE SHIELD | Admitting: Family Medicine

## 2015-06-12 DIAGNOSIS — Z029 Encounter for administrative examinations, unspecified: Secondary | ICD-10-CM

## 2016-04-13 ENCOUNTER — Encounter (HOSPITAL_COMMUNITY): Payer: Self-pay

## 2016-04-13 ENCOUNTER — Emergency Department (HOSPITAL_COMMUNITY)
Admission: EM | Admit: 2016-04-13 | Discharge: 2016-04-13 | Disposition: A | Discharge status: Home / Self Care | Payer: BLUE CROSS/BLUE SHIELD | Attending: Emergency Medicine | Admitting: Emergency Medicine

## 2016-04-13 DIAGNOSIS — R112 Nausea with vomiting, unspecified: Secondary | ICD-10-CM | POA: Insufficient documentation

## 2016-04-13 DIAGNOSIS — R197 Diarrhea, unspecified: Secondary | ICD-10-CM | POA: Diagnosis not present

## 2016-04-13 DIAGNOSIS — R1031 Right lower quadrant pain: Secondary | ICD-10-CM | POA: Diagnosis not present

## 2016-04-13 MED ORDER — SODIUM CHLORIDE 0.9 % IV BOLUS (SEPSIS)
1000.0000 mL | Freq: Once | INTRAVENOUS | Status: AC
Start: 2016-04-13 — End: 2016-04-13
  Administered 2016-04-13: 1000 mL via INTRAVENOUS

## 2016-04-13 MED ORDER — ONDANSETRON HCL 4 MG/2ML IJ SOLN
4.0000 mg | Freq: Once | INTRAMUSCULAR | Status: AC
  Administered 2016-04-13: 4 mg via INTRAVENOUS
  Filled 2016-04-13: qty 2

## 2016-04-13 MED ORDER — KETOROLAC TROMETHAMINE 30 MG/ML IJ SOLN
30.0000 mg | Freq: Once | INTRAMUSCULAR | Status: AC
  Administered 2016-04-13: 30 mg via INTRAVENOUS
  Filled 2016-04-13: qty 1

## 2016-04-13 MED ORDER — SODIUM CHLORIDE 0.9 % IV BOLUS (SEPSIS)
1000.0000 mL | Freq: Once | INTRAVENOUS | Status: AC
  Administered 2016-04-13: 1000 mL via INTRAVENOUS

## 2016-04-13 MED ORDER — ONDANSETRON 8 MG PO TBDP
8.0000 mg | ORAL_TABLET | Freq: Three times a day (TID) | ORAL | Status: DC | PRN
Start: 2016-04-13 — End: 2016-05-10

## 2016-04-13 NOTE — Discharge Instructions (Signed)

## 2016-04-13 NOTE — ED Notes (Signed)
Pt c/o n/v/d yesterday, today still nauseated and having some diarrhea but reports rlq pain and headache.

## 2016-04-13 NOTE — ED Provider Notes (Signed)
CSN: [id number]     Arrival date & time 04/13/16  1622 History   First MD Initiated Contact with Patient 04/13/16 1641     Chief Complaint  Patient presents with  . Abdominal Pain      Patient is a 20 y.o. male presenting with abdominal pain. The history is provided by the patient.  Abdominal Pain Associated symptoms: diarrhea, nausea and vomiting   Associated symptoms: no chest pain, no dysuria and no shortness of breath   Patient presents with nausea vomiting diarrhea and some right lower quadrant abdominal pain. Began yesterday. Patient states his history chronic right-sided abdominal pain. States it flares up from time to time. No known sick contacts. He has had decreased appetite and is not eaten much today. No dysuria. No fevers. The pain is dull. It is somewhat crampy. It is like similar pain that he has had the past.  Past Medical History  Diagnosis Date  . Enuresis   . Chronic headaches    History reviewed. No pertinent past surgical history. No family history on file. Social History  Substance Use Topics  . Smoking status: Never Smoker   . Smokeless tobacco: Current User    Types: Snuff  . Alcohol Use: Yes     Comment: occ    Review of Systems  Constitutional: Positive for appetite change. Negative for activity change.  Eyes: Negative for pain.  Respiratory: Negative for chest tightness and shortness of breath.   Cardiovascular: Negative for chest pain and leg swelling.  Gastrointestinal: Positive for nausea, vomiting, abdominal pain and diarrhea.  Genitourinary: Negative for dysuria, frequency, flank pain and penile pain.  Musculoskeletal: Negative for back pain and neck stiffness.  Skin: Negative for rash.  Neurological: Negative for weakness, numbness and headaches.  Psychiatric/Behavioral: Negative for behavioral problems.      Allergies  Codeine  Home Medications   Prior to Admission medications   Medication Sig Start Date End Date Taking? Authorizing  Provider  ondansetron (ZOFRAN-ODT) 8 MG disintegrating tablet Take 1 tablet (8 mg total) by mouth every 8 (eight) hours as needed for nausea or vomiting. 04/13/16   Davonna Belling, MD   BP 134/83 mmHg  Pulse 67  Temp(Src) 98 F (36.7 C) (Oral)  Resp 18  Ht 5\' 10"  (1.778 m)  Wt 230 lb (104.327 kg)  BMI 33.00 kg/m2  SpO2 96% Physical Exam  Constitutional: He is oriented to person, place, and time. He appears well-developed and well-nourished.  HENT:  Head: Normocephalic and atraumatic.  Eyes: EOM are normal. Pupils are equal, round, and reactive to light.  Neck: Normal range of motion. Neck supple.  Cardiovascular: Normal rate, regular rhythm and normal heart sounds.   No murmur heard. Pulmonary/Chest: Effort normal and breath sounds normal.  Abdominal: Soft. Bowel sounds are normal. He exhibits no distension. There is no tenderness. There is no rebound and no guarding.  Musculoskeletal: Normal range of motion.  Neurological: He is alert and oriented to person, place, and time.  Skin: Skin is warm. No erythema.  Psychiatric: He has a normal mood and affect.  Nursing note and vitals reviewed.   ED Course  Procedures (including critical care time) Labs Review Labs Reviewed - No data to display  Imaging Review No results found. I have personally reviewed and evaluated these images and lab results as part of my medical decision-making.   EKG Interpretation None      MDM   Final diagnoses:  Nausea vomiting and diarrhea  Patient nausea vomiting diarrhea. Feels better after treatment. Files reassuring. Tolerated orals will be discharged home.    Davonna Belling, MD 04/13/16 Einar Crow

## 2016-04-13 NOTE — ED Notes (Signed)
Given patient some water.  

## 2016-05-10 ENCOUNTER — Emergency Department (HOSPITAL_COMMUNITY)
Admission: EM | Admit: 2016-05-10 | Discharge: 2016-05-10 | Disposition: A | Discharge status: Home / Self Care | Payer: BLUE CROSS/BLUE SHIELD | Attending: Emergency Medicine | Admitting: Emergency Medicine

## 2016-05-10 ENCOUNTER — Encounter (HOSPITAL_COMMUNITY): Payer: Self-pay | Admitting: Emergency Medicine

## 2016-05-10 DIAGNOSIS — H6121 Impacted cerumen, right ear: Secondary | ICD-10-CM

## 2016-05-10 DIAGNOSIS — Z79899 Other long term (current) drug therapy: Secondary | ICD-10-CM | POA: Diagnosis not present

## 2016-05-10 DIAGNOSIS — H9201 Otalgia, right ear: Secondary | ICD-10-CM | POA: Diagnosis present

## 2016-05-10 MED ORDER — NEOMYCIN-POLYMYXIN-HC 3.5-10000-1 OT SUSP: 4.0000 [drp] | Freq: Three times a day (TID) | OTIC | Status: DC
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
## 2016-05-10 NOTE — Discharge Instructions (Signed)
Cerumen Impaction The structures of the external ear canal secrete a waxy substance known as cerumen. Excess cerumen can build up in the ear canal, causing a condition known as cerumen impaction. Cerumen impaction can cause ear pain and disrupt the function of the ear. The rate of cerumen production differs for each individual. In certain individuals, the configuration of the ear canal may decrease his or her ability to naturally remove cerumen. CAUSES Cerumen impaction is caused by excessive cerumen production or buildup. RISK FACTORS  Frequent use of swabs to clean ears.  Having narrow ear canals.  Having eczema.  Being dehydrated. SIGNS AND SYMPTOMS  Diminished hearing.  Ear drainage.  Ear pain.  Ear itch. TREATMENT Treatment may involve:  Over-the-counter or prescription ear drops to soften the cerumen.  Removal of cerumen by a health care provider. This may be done with:  Irrigation with warm water. This is the most common method of removal.  Ear curettes and other instruments.  Surgery. This may be done in severe cases. HOME CARE INSTRUCTIONS  Take medicines only as directed by your health care provider.  Do not insert objects into the ear with the intent of cleaning the ear. PREVENTION  Do not insert objects into the ear, even with the intent of cleaning the ear. Removing cerumen as a part of normal hygiene is not necessary, and the use of swabs in the ear canal is not recommended.  Drink enough water to keep your urine clear or pale yellow.  Control your eczema if you have it. SEEK MEDICAL CARE IF:  You develop ear pain.  You develop bleeding from the ear.  The cerumen does not clear after you use ear drops as directed.   This information is not intended to replace advice given to you by your health care provider. Make sure you discuss any questions you have with your health care provider.   Document Released: 11/21/2004 Document Revised: 11/04/2014  Document Reviewed: 05/31/2015 Elsevier Interactive Patient Education 2016 [place] Drops, Adult You have been diagnosed with a condition requiring you to put drops of medicine into your outer ear. HOME CARE INSTRUCTIONS   Put drops in the affected ear as instructed. After putting the drops in, you will need to lie down with the affected ear facing up for ten minutes so the drops will remain in the ear canal and run down and fill the canal. Continue using the ear drops for as long as directed by your health care provider.  Prior to getting up, put a cotton ball gently in your ear canal. Leave enough of the cotton ball out so it can be easily removed. Do not attempt to push this down into the canal with a cotton-tipped swab or other instrument.  Do not irrigate or wash out your ears if you have had a perforated eardrum or mastoid surgery, or unless instructed to do so by your health care provider.  Keep appointments with your health care provider as instructed.  Finish all medicine, or use for the length of time prescribed by your health care provider. Continue the drops even if your problem seems to be doing well after a couple days, or continue as instructed. SEEK MEDICAL CARE IF:  You become worse or develop increasing pain.  You notice any unusual drainage from your ear (particularly if the drainage has a bad smell).  You develop hearing difficulties.  You experience a serious form of dizziness in which you feel as if the  room is spinning, and you feel nauseated (vertigo).  The outside of your ear becomes red or swollen or both. This may be a sign of an allergic reaction. MAKE SURE YOU:   Understand these instructions.  Will watch your condition.  Will get help right away if you are not doing well or get worse.   This information is not intended to replace advice given to you by your health care provider. Make sure you discuss any questions you have with your health  care provider.   Document Released: 10/08/2001 Document Revised: 11/04/2014 Document Reviewed: 05/11/2013 Elsevier Interactive Patient Education [employer].

## 2016-05-10 NOTE — ED Notes (Signed)
Patient complaining of pain to right ear x 2 days.

## 2016-05-10 NOTE — ED Notes (Signed)
Hydrogen peroxide to right ear.

## 2016-09-23 ENCOUNTER — Encounter (HOSPITAL_COMMUNITY): Payer: Self-pay | Admitting: Emergency Medicine

## 2016-09-23 ENCOUNTER — Emergency Department (HOSPITAL_COMMUNITY): Payer: BLUE CROSS/BLUE SHIELD

## 2016-09-23 ENCOUNTER — Emergency Department (HOSPITAL_COMMUNITY)
Admission: EM | Admit: 2016-09-23 | Discharge: 2016-09-23 | Disposition: A | Discharge status: Home / Self Care | Payer: BLUE CROSS/BLUE SHIELD | Attending: Emergency Medicine | Admitting: Emergency Medicine

## 2016-09-23 DIAGNOSIS — S3992XA Unspecified injury of lower back, initial encounter: Secondary | ICD-10-CM | POA: Diagnosis present

## 2016-09-23 DIAGNOSIS — W1839XA Other fall on same level, initial encounter: Secondary | ICD-10-CM | POA: Diagnosis not present

## 2016-09-23 DIAGNOSIS — Y9389 Activity, other specified: Secondary | ICD-10-CM | POA: Diagnosis not present

## 2016-09-23 DIAGNOSIS — Z85830 Personal history of malignant neoplasm of bone: Secondary | ICD-10-CM | POA: Diagnosis not present

## 2016-09-23 DIAGNOSIS — Y999 Unspecified external cause status: Secondary | ICD-10-CM | POA: Diagnosis not present

## 2016-09-23 DIAGNOSIS — M545 Low back pain: Secondary | ICD-10-CM | POA: Diagnosis not present

## 2016-09-23 DIAGNOSIS — Z87891 Personal history of nicotine dependence: Secondary | ICD-10-CM | POA: Insufficient documentation

## 2016-09-23 DIAGNOSIS — S39012A Strain of muscle, fascia and tendon of lower back, initial encounter: Secondary | ICD-10-CM | POA: Diagnosis not present

## 2016-09-23 DIAGNOSIS — Y929 Unspecified place or not applicable: Secondary | ICD-10-CM | POA: Diagnosis not present

## 2016-09-23 MED ORDER — KETOROLAC TROMETHAMINE 60 MG/2ML IM SOLN
60.0000 mg | Freq: Once | INTRAMUSCULAR | Status: AC
  Administered 2016-09-23: 60 mg via INTRAMUSCULAR
  Filled 2016-09-23: qty 2

## 2016-09-23 MED ORDER — METHOCARBAMOL 500 MG PO TABS
500.0000 mg | ORAL_TABLET | Freq: Once | ORAL | Status: AC
  Administered 2016-09-23: 500 mg via ORAL
  Filled 2016-09-23: qty 1

## 2016-09-23 MED ORDER — DIAZEPAM 5 MG PO TABS: 5.0000 mg | ORAL_TABLET | Freq: Three times a day (TID) | ORAL | 0 refills | Status: DC | PRN

## 2016-09-23 MED ORDER — DICLOFENAC SODIUM 75 MG PO TBEC: 75.0000 mg | DELAYED_RELEASE_TABLET | Freq: Two times a day (BID) | ORAL | 0 refills | Status: DC

## 2016-09-23 NOTE — Discharge Instructions (Signed)
Apply ice packs on/off.  Follow-up with your doctor for recheck if not improving

## 2016-09-23 NOTE — ED Triage Notes (Signed)
Pt reports bending over to place some stakes in the ground. Pt states he "heard a pop in his lower back." Pt c/o pain since last night.

## 2016-09-23 NOTE — ED Provider Notes (Signed)
[facility] DEPT Provider Note   CSN: [id number] Arrival date & time: 09/23/16  1409  By signing my name below, I, Ryan Carey, attest that this documentation has been prepared under the direction and in the presence of Ryan Richwine, PA-C. Electronically Signed: Judithann Carey, ED Scribe. 09/23/16. 3:54 PM.   History   Chief Complaint Chief Complaint  Patient presents with  . Back Pain    HPI Comments: Ryan Carey is a 20 y.o. male who presents to the Emergency Department complaining of gradually worsening non-radiating moderate  left lower back pain s/p bending over last night. He notes that the pain is worse with deep breathing, movement, and standing up straight but better when he stays still. He explains that he was bending over to place stakes into the ground when he head a "pop", causing him to fall on the ground. He denies any head injuries, LOC, or any other injuries sustained. Pt has been able to ambulate since the incident although he states it is painful. No alleviating factors noted. Pt states that he has tried 800 mg ibuprofen with no relief.  He denies any fever, chills, vomiting, bowel/bladder incontinence, urinary symptoms, numbness/tingling in BLE, weakness, or any other symptoms.   The history is provided by the patient. No language interpreter was used.  Back Pain   This is a new problem. The current episode started yesterday. The problem has been gradually worsening. The pain is associated with falling. The pain is present in the lumbar spine. The pain does not radiate. The pain is moderate. The symptoms are aggravated by bending, twisting and certain positions. The pain is the same all the time. Pertinent negatives include no fever, no numbness, no abdominal pain, no bowel incontinence, no bladder incontinence, no dysuria, no tingling and no weakness. He has tried NSAIDs for the symptoms. The treatment provided no relief.    Past Medical History:    Diagnosis Date  . Chronic headaches   . Enuresis     Patient Active Problem List   Diagnosis Date Noted  . 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 No pertinent surgical history.     Home Medications    Prior to Admission medications   Medication Sig Start Date End Date Taking? Authorizing Provider  neomycin-polymyxin-hydrocortisone (CORTISPORIN) 3.5-10000-1 otic suspension Place 4 drops into the right ear 3 (three) times daily. 05/10/16   Evalee Jefferson, PA-C    Family History History reviewed. No pertinent family history.  Social History Social History  Substance Use Topics  . Smoking status: Never Smoker  . Smokeless tobacco: Former Systems developer    Types: Snuff  . Alcohol use Yes     Comment: occ     Allergies   Codeine   Review of Systems Review of Systems  Constitutional: Negative for chills and fever.  Respiratory: Negative for shortness of breath.   Gastrointestinal: Negative for abdominal pain, bowel incontinence, constipation and vomiting.  Genitourinary: Negative for bladder incontinence, decreased urine volume, difficulty urinating, dysuria, flank pain and hematuria.  Musculoskeletal: Positive for back pain. Negative for joint swelling.  Skin: Negative for rash.  Neurological: Negative for tingling, weakness and numbness.  All other systems reviewed and are negative.    Physical Exam Updated Vital Signs BP 142/99 (BP Location: Left Arm)   Pulse 97   Temp 98 F (36.7 C) (Oral)   Resp 20   Ht 5\' 11"  (1.803 m)   Wt 255 lb 9.6 oz (115.9 kg)   SpO2  100%   BMI 35.65 kg/m   Physical Exam  Constitutional: He is oriented to person, place, and time. He appears well-developed and well-nourished.  HENT:  Head: Normocephalic and atraumatic.  Mouth/Throat: Oropharynx is clear and moist.  Neck: Normal range of motion.  Cardiovascular: Normal rate, regular rhythm and intact distal pulses.   Pulmonary/Chest: Effort normal.  Abdominal: Soft. He exhibits no  distension. There is no tenderness. There is no guarding.  Musculoskeletal: He exhibits tenderness. He exhibits no edema.  Left Lower lumbar paraspinal muscle tenderness; negative bilateral straight leg raise. Sensation intact  Neurological: He is alert and oriented to person, place, and time. No sensory deficit. He exhibits normal muscle tone.  Skin: Skin is warm and dry.  Psychiatric: He has a normal mood and affect.  Nursing note and vitals reviewed.    ED Treatments / Results  DIAGNOSTIC STUDIES: Oxygen Saturation is 100% on RA, normal by my interpretation.    COORDINATION OF CARE: 3:48 PM- Pt advised of plan for treatment and pt agrees. Pt informed of her lumbar spine x-ray. He will receive 60 mg Toradol IM and Robaxin.    Labs (all labs ordered are listed, but only abnormal results are displayed) Labs Reviewed - No data to display  EKG  EKG Interpretation None       Radiology Dg Lumbar Spine Complete  Result Date: 09/23/2016 CLINICAL DATA:  Pt states he bent over last night to drive a stake in the ground now with pain in lower back/no leg pain/no hx surgery EXAM: LUMBAR SPINE - COMPLETE 4+ VIEW COMPARISON:  05/04/2015 FINDINGS: There is no evidence of lumbar spine fracture. Alignment is normal. Intervertebral disc spaces are maintained. 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 No evidence for spondylolisthesis. IMPRESSION: No evidence for acute abnormality. 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 Electronically Signed   By: Nolon Nations M.D.   On: 09/23/2016 14:42    Procedures Procedures (including critical care time)  Medications Ordered in ED Medications - No data to display   Initial Impression / Assessment and Plan / ED Course  [name], PA-C has reviewed the triage vital signs and the nursing notes.  Pertinent labs & imaging results that were available during my care of the patient were reviewed by me  and considered in my medical decision making (see chart for details).  Clinical Course     Pt well appearing.  No focal neuro deficits on exam.  No concerning sx's for emergent neurological or infectious process.  Pt agrees to symptomatic tx and PMD f/u if needed.   Final Clinical Impressions(s) / ED Diagnoses   Final diagnoses:  Strain of lumbar region, initial encounter    New Prescriptions New Prescriptions   No medications on file   I personally performed the services described in this documentation, which was scribed in my presence. The recorded information has been reviewed and is accurate.    Kem Parkinson, PA-C 09/26/16 1548    Elnora Morrison, MD 09/28/16 [phone]

## 2017-03-11 ENCOUNTER — Encounter (HOSPITAL_COMMUNITY): Payer: Self-pay | Admitting: *Deleted

## 2017-03-11 ENCOUNTER — Emergency Department (HOSPITAL_COMMUNITY): Payer: BLUE CROSS/BLUE SHIELD

## 2017-03-11 ENCOUNTER — Emergency Department (HOSPITAL_COMMUNITY)
Admission: EM | Admit: 2017-03-11 | Discharge: 2017-03-11 | Disposition: A | Discharge status: Home / Self Care | Payer: BLUE CROSS/BLUE SHIELD | Attending: Emergency Medicine | Admitting: Emergency Medicine

## 2017-03-11 DIAGNOSIS — R197 Diarrhea, unspecified: Secondary | ICD-10-CM | POA: Diagnosis not present

## 2017-03-11 DIAGNOSIS — R1084 Generalized abdominal pain: Secondary | ICD-10-CM

## 2017-03-11 DIAGNOSIS — R112 Nausea with vomiting, unspecified: Secondary | ICD-10-CM

## 2017-03-11 DIAGNOSIS — Z87891 Personal history of nicotine dependence: Secondary | ICD-10-CM | POA: Insufficient documentation

## 2017-03-11 LAB — CBC WITH DIFFERENTIAL/PLATELET
Basophils Absolute: 0 10*3/uL (ref 0.0–0.1)
Basophils Relative: 0 %
Eosinophils Absolute: 0.2 10*3/uL (ref 0.0–0.7)
Eosinophils Relative: 2 %
HCT: 45.7 % (ref 39.0–52.0)
Hemoglobin: 15.8 g/dL (ref 13.0–17.0)
Lymphocytes Relative: 10 %
Lymphs Abs: 1.5 10*3/uL (ref 0.7–4.0)
MCH: 27.6 pg (ref 26.0–34.0)
MCHC: 34.6 g/dL (ref 30.0–36.0)
MCV: 79.9 fL (ref 78.0–100.0)
Monocytes Absolute: 1 10*3/uL (ref 0.1–1.0)
Monocytes Relative: 7 %
Neutro Abs: 12 10*3/uL — ABNORMAL HIGH (ref 1.7–7.7)
Neutrophils Relative %: 81 %
Platelets: 264 10*3/uL (ref 150–400)
RBC: 5.72 MIL/uL (ref 4.22–5.81)
RDW: 13.3 % (ref 11.5–15.5)
WBC: 14.7 10*3/uL — ABNORMAL HIGH (ref 4.0–10.5)

## 2017-03-11 LAB — COMPREHENSIVE METABOLIC PANEL
ALT: 132 U/L — ABNORMAL HIGH (ref 17–63)
AST: 55 U/L — ABNORMAL HIGH (ref 15–41)
Albumin: 4.5 g/dL (ref 3.5–5.0)
Alkaline Phosphatase: 56 U/L (ref 38–126)
Anion gap: 10 (ref 5–15)
BUN: 14 mg/dL (ref 6–20)
CO2: 25 mmol/L (ref 22–32)
Calcium: 9.4 mg/dL (ref 8.9–10.3)
Chloride: 105 mmol/L (ref 101–111)
Creatinine, Ser: 0.83 mg/dL (ref 0.61–1.24)
GFR calc Af Amer: >60 mL/min (ref >60)
GFR calc non Af Amer: >60 mL/min (ref >60)
Glucose, Bld: 110 mg/dL — ABNORMAL HIGH (ref 65–99)
Potassium: 4.1 mmol/L (ref 3.5–5.1)
Sodium: 140 mmol/L (ref 135–145)
Total Bilirubin: 1.3 mg/dL — ABNORMAL HIGH (ref 0.3–1.2)
Total Protein: 8.5 g/dL — ABNORMAL HIGH (ref 6.5–8.1)

## 2017-03-11 LAB — LIPASE, BLOOD: Lipase: 16 U/L (ref 11–51)

## 2017-03-11 MED ORDER — ONDANSETRON HCL 4 MG/2ML IJ SOLN
4.0000 mg | Freq: Once | INTRAMUSCULAR | Status: AC
  Administered 2017-03-11: 4 mg via INTRAVENOUS
  Filled 2017-03-11: qty 2

## 2017-03-11 MED ORDER — KETOROLAC TROMETHAMINE 30 MG/ML IJ SOLN
15.0000 mg | Freq: Once | INTRAMUSCULAR | Status: AC
  Administered 2017-03-11: 15 mg via INTRAVENOUS
  Filled 2017-03-11: qty 1

## 2017-03-11 MED ORDER — SODIUM CHLORIDE 0.9 % IV BOLUS (SEPSIS)
1000.0000 mL | Freq: Once | INTRAVENOUS | Status: AC
  Administered 2017-03-11: 1000 mL via INTRAVENOUS

## 2017-03-11 MED ORDER — IOPAMIDOL (ISOVUE-300) INJECTION 61%: 100.0000 mL | Freq: Once | INTRAVENOUS | Status: DC | PRN

## 2017-03-11 MED ORDER — ONDANSETRON 4 MG PO TBDP: 4.0000 mg | ORAL_TABLET | Freq: Three times a day (TID) | ORAL | 0 refills | Status: DC | PRN

## 2017-03-11 NOTE — ED Provider Notes (Signed)
[facility] DEPT Provider Note   CSN: [id number] Arrival date & time: 03/11/17  1854     History   Chief Complaint Chief Complaint  Patient presents with  . Abdominal Pain    HPI Ryan Carey is a 21 y.o. male.  HPI  Patient presents with his mother who assists with history of present illness. Patient was well until this morning, approximately 12 hours ago, when he noticed abdominal discomfort. Subsequently he has had multiple episodes of diarrhea Patient twice felt slightly better, try to eat something, which was well tolerated, and he had emesis after each attempt. Currently the patient is Pain in the epigastrium, periumbilical region, none on the right, nor left. Pain is sore, crampy. There is associated nausea, anorexia, but no additional vomiting. Patient was well prior to this, has no medical problems, no surgical history. He denies history of travel, new exposures, sick contacts or any other processes.  She has received Zofran, by his mother, with possible decrease in nausea, and no additional vomiting.  Past Medical History:  Diagnosis Date  . Chronic headaches   . Enuresis     Patient Active Problem List   Diagnosis Date Noted  . 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 No pertinent surgical history.     Home Medications    Prior to Admission medications   Medication Sig Start Date End Date Taking? Authorizing Provider  diazepam (VALIUM) 5 MG tablet Take 1 tablet (5 mg total) by mouth every 8 (eight) hours as needed for muscle spasms. 09/23/16   Triplett, Tammy, PA-C  diclofenac (VOLTAREN) 75 MG EC tablet Take 1 tablet (75 mg total) by mouth 2 (two) times daily. Take with food 09/23/16   Triplett, Tammy, PA-C  neomycin-polymyxin-hydrocortisone (CORTISPORIN) 3.5-10000-1 otic suspension Place 4 drops into the right ear 3 (three) times daily. 05/10/16   Evalee Jefferson, PA-C    Family History No family history on file.  Social History Social  History  Substance Use Topics  . Smoking status: Never Smoker  . Smokeless tobacco: Former Systems developer    Types: Snuff  . Alcohol use Yes     Comment: occ     Allergies   Codeine   Review of Systems Review of Systems  Constitutional:       Per HPI, otherwise negative  HENT:       Per HPI, otherwise negative  Respiratory:       Per HPI, otherwise negative  Cardiovascular:       Per HPI, otherwise negative  Gastrointestinal: Positive for abdominal pain, diarrhea, nausea and vomiting.  Endocrine:       Negative aside from HPI  Genitourinary:       Neg aside from HPI   Musculoskeletal:       Per HPI, otherwise negative  Skin: Negative.   Neurological: Negative for syncope.     Physical Exam Updated Vital Signs BP (!) 142/101   Pulse (!) 107   Temp 97.7 F (36.5 C) (Oral)   Resp 20   Ht 5\' 11"  (1.803 m)   Wt 245 lb (111.1 kg)   SpO2 99%   BMI 34.17 kg/m   Physical Exam  Constitutional: He is oriented to person, place, and time. He appears well-developed. No distress.  HENT:  Head: Normocephalic and atraumatic.  Eyes: Conjunctivae and EOM are normal.  Cardiovascular: Normal rate and regular rhythm.   Pulmonary/Chest: Effort normal. No stridor. No respiratory distress.  Abdominal: He exhibits no distension.  Tenderness  to palpation about the supraumbilical region, no guarding, no peritoneal findings, no right lower quadrant, no right upper quadrant pain  Musculoskeletal: He exhibits no edema.  Neurological: He is alert and oriented to person, place, and time.  Skin: Skin is warm and dry.  Psychiatric: He has a normal mood and affect.  Nursing note and vitals reviewed.    ED Treatments / Results  Labs (all labs ordered are listed, but only abnormal results are displayed) Labs Reviewed  LIPASE, BLOOD  COMPREHENSIVE METABOLIC PANEL  CBC WITH DIFFERENTIAL/PLATELET    Radiology No results found.  Procedures Procedures (including critical care  time)  Medications Ordered in ED Medications  ketorolac (TORADOL) 30 MG/ML injection 15 mg (not administered)  ondansetron (ZOFRAN) injection 4 mg (not administered)  sodium chloride 0.9 % bolus 1,000 mL (1,000 mLs Intravenous New Bag/Given 03/11/17 1936)     Initial Impression / Assessment and Plan / ED Course  I have reviewed the triage vital signs and the nursing notes.  Pertinent labs & imaging results that were available during my care of the patient were reviewed by me and considered in my medical decision making (see chart for details).   After the initial evaluation the patient received IV fluids, antiemetics, analgesia.  8:23 PM Patient's color appears better, states that he feels better, indicates pain in the upper abdomen, nontender lower abdomen, no additional episodes of vomiting, nor diarrhea.  9:31 PM Patient even better, minimal complaints currently. I discussed all findings again with him and his mother, specifically return precautions, likelihood of gastroenteritis, need for slow advancing of diet, adequate fluid resuscitation for the coming days, follow-up as needed.  Absent evidence for peritonitis, with low clinical evidence for appendicitis, and with substantial improvement patient is appropriate for discharge. Final Clinical Impressions(s) / ED Diagnoses  Nausea vomiting and diarrhea Abdominal pain   Carmin Muskrat, MD 03/11/17 2132

## 2017-03-11 NOTE — Discharge Instructions (Signed)
As discussed, your evaluation today has been largely reassuring.  But, it is important that you monitor your condition carefully, and do not hesitate to return to the ED if you develop new, or concerning changes in your condition. ? ?Otherwise, please follow-up with your physician for appropriate ongoing care. ? ?

## 2017-03-11 NOTE — ED Triage Notes (Signed)
Abdominal pain with diarrhea 

## 2017-03-11 NOTE — ED Notes (Signed)
Pt alert & oriented x4, stable gait. Patient given discharge instructions, paperwork & prescription(s). Patient  instructed to stop at the registration desk to finish any additional paperwork. Patient verbalized understanding. Pt left department w/ no further questions. 

## 2017-12-14 ENCOUNTER — Emergency Department (HOSPITAL_COMMUNITY): Payer: BLUE CROSS/BLUE SHIELD

## 2017-12-14 ENCOUNTER — Encounter (HOSPITAL_COMMUNITY): Payer: Self-pay

## 2017-12-14 ENCOUNTER — Emergency Department (HOSPITAL_COMMUNITY)
Admission: EM | Admit: 2017-12-14 | Discharge: 2017-12-14 | Disposition: A | Discharge status: Home / Self Care | Payer: BLUE CROSS/BLUE SHIELD | Attending: Emergency Medicine | Admitting: Emergency Medicine

## 2017-12-14 DIAGNOSIS — R1084 Generalized abdominal pain: Secondary | ICD-10-CM | POA: Diagnosis not present

## 2017-12-14 DIAGNOSIS — Z87891 Personal history of nicotine dependence: Secondary | ICD-10-CM | POA: Diagnosis not present

## 2017-12-14 LAB — CBC
HCT: 42.5 % (ref 39.0–52.0)
Hemoglobin: 14.6 g/dL (ref 13.0–17.0)
MCH: 27.4 pg (ref 26.0–34.0)
MCHC: 34.4 g/dL (ref 30.0–36.0)
MCV: 79.9 fL (ref 78.0–100.0)
Platelets: 274 10*3/uL (ref 150–400)
RBC: 5.32 MIL/uL (ref 4.22–5.81)
RDW: 13.3 % (ref 11.5–15.5)
WBC: 9.3 10*3/uL (ref 4.0–10.5)

## 2017-12-14 LAB — URINALYSIS, ROUTINE W REFLEX MICROSCOPIC
Bilirubin Urine: NEGATIVE
Glucose, UA: NEGATIVE mg/dL
Hgb urine dipstick: NEGATIVE
Ketones, ur: NEGATIVE mg/dL
Leukocytes, UA: NEGATIVE
Nitrite: NEGATIVE
Protein, ur: NEGATIVE mg/dL
Specific Gravity, Urine: 1.02 (ref 1.005–1.030)
pH: 7 (ref 5.0–8.0)

## 2017-12-14 LAB — COMPREHENSIVE METABOLIC PANEL
ALT: 82 U/L — ABNORMAL HIGH (ref 17–63)
AST: 42 U/L — ABNORMAL HIGH (ref 15–41)
Albumin: 4 g/dL (ref 3.5–5.0)
Alkaline Phosphatase: 57 U/L (ref 38–126)
Anion gap: 10 (ref 5–15)
BUN: 11 mg/dL (ref 6–20)
CO2: 24 mmol/L (ref 22–32)
Calcium: 9.2 mg/dL (ref 8.9–10.3)
Chloride: 104 mmol/L (ref 101–111)
Creatinine, Ser: 0.86 mg/dL (ref 0.61–1.24)
GFR calc Af Amer: >60 mL/min (ref >60)
GFR calc non Af Amer: >60 mL/min (ref >60)
Glucose, Bld: 100 mg/dL — ABNORMAL HIGH (ref 65–99)
Potassium: 4 mmol/L (ref 3.5–5.1)
Sodium: 138 mmol/L (ref 135–145)
Total Bilirubin: 1.2 mg/dL (ref 0.3–1.2)
Total Protein: 7.5 g/dL (ref 6.5–8.1)

## 2017-12-14 LAB — LIPASE, BLOOD: Lipase: 26 U/L (ref 11–51)

## 2017-12-14 MED ORDER — ONDANSETRON HCL 4 MG/2ML IJ SOLN
4.0000 mg | Freq: Once | INTRAMUSCULAR | Status: AC
  Administered 2017-12-14: 4 mg via INTRAVENOUS
  Filled 2017-12-14: qty 2

## 2017-12-14 MED ORDER — HYDROMORPHONE HCL 1 MG/ML IJ SOLN
0.5000 mg | Freq: Once | INTRAMUSCULAR | Status: AC
  Administered 2017-12-14: 0.5 mg via INTRAVENOUS
  Filled 2017-12-14: qty 1

## 2017-12-14 MED ORDER — IOPAMIDOL (ISOVUE-300) INJECTION 61%
100.0000 mL | Freq: Once | INTRAVENOUS | Status: AC | PRN
  Administered 2017-12-14: 100 mL via INTRAVENOUS

## 2017-12-14 MED ORDER — FAMOTIDINE 20 MG PO TABS: 20.0000 mg | ORAL_TABLET | Freq: Two times a day (BID) | ORAL | 0 refills | Status: DC

## 2017-12-14 NOTE — ED Provider Notes (Signed)
[facility] EMERGENCY DEPARTMENT Provider Note   CSN: [id number] Arrival date & time: 12/14/17  1501     History   Chief Complaint No chief complaint on file.   HPI Ryan Carey is a 22 y.o. male.  The history is provided by the patient. No language interpreter was used.  Abdominal Pain   This is a new problem. The current episode started more than 1 week ago. The problem occurs constantly. The pain is located in the RUQ. The pain is moderate. Nothing aggravates the symptoms. Nothing relieves the symptoms. Past workup does not include GI consult.    Past Medical History:  Diagnosis Date  . Chronic headaches   . Enuresis     Patient Active Problem List   Diagnosis Date Noted  . 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 No pertinent surgical history.     Home Medications    Prior to Admission medications   Medication Sig Start Date End Date Taking? Authorizing Provider  famotidine (PEPCID) 20 MG tablet Take 1 tablet (20 mg total) by mouth 2 (two) times daily. 12/14/17   Fransico Meadow, PA-C  ondansetron (ZOFRAN ODT) 4 MG disintegrating tablet Take 1 tablet (4 mg total) by mouth every 8 (eight) hours as needed for nausea or vomiting. 03/11/17   Carmin Muskrat, MD    Family History No family history on file.  Social History Social History   Tobacco Use  . Smoking status: Never Smoker  . Smokeless tobacco: Former Systems developer    Types: Snuff  Substance Use Topics  . Alcohol use: Yes    Comment: occ  . Drug use: No     Allergies   Codeine   Review of Systems Review of Systems  Gastrointestinal: Positive for abdominal pain.  All other systems reviewed and are negative.    Physical Exam Updated Vital Signs BP (!) 149/92 (BP Location: Right Arm)   Pulse 79   Temp 98.4 F (36.9 C) (Oral)   Resp 16   SpO2 99%   Physical Exam  Constitutional: He appears well-developed and well-nourished.  HENT:  Head: Normocephalic and  atraumatic.  Right Ear: External ear normal.  Left Ear: External ear normal.  Nose: Nose normal.  Mouth/Throat: Oropharynx is clear and moist.  Eyes: Conjunctivae are normal.  Neck: Normal range of motion. Neck supple.  Cardiovascular: Normal rate and regular rhythm.  No murmur heard. Pulmonary/Chest: Effort normal and breath sounds normal. No respiratory distress.  Abdominal: Soft. He exhibits no mass. There is tenderness. There is no rebound.  Tender right upper quadrant ,    Musculoskeletal: Normal range of motion. He exhibits no edema.  Neurological: He is alert.  Skin: Skin is warm and dry.  Psychiatric: He has a normal mood and affect.  Nursing note and vitals reviewed.    ED Treatments / Results  Labs (all labs ordered are listed, but only abnormal results are displayed) Labs Reviewed  COMPREHENSIVE METABOLIC PANEL - Abnormal; Notable for the following components:      Result Value   Glucose, Bld 100 (*)    AST 42 (*)    ALT 82 (*)    All other components within normal limits  LIPASE, BLOOD  CBC  URINALYSIS, ROUTINE W REFLEX MICROSCOPIC    EKG  EKG Interpretation None       Radiology US Abdomen Complete  Result Date: 12/14/2017 CLINICAL DATA:  Abdominal pain for 10 days. EXAM: ABDOMEN ULTRASOUND COMPLETE COMPARISON:  None. FINDINGS: Gallbladder: The gallbladder is decompressed limiting evaluation. No Murphy's sign or pericholecystic fluid. No wall thickening. Small polyps are identified measuring up to 6 mm. Common bile duct: Diameter: 4.7 mm Liver: There is a solid 3.1 x 3.6 x 3.2 cm mass in the left hepatic lobe. Increased hepatic echogenicity identified. Portal vein is patent on color Doppler imaging with normal direction of blood flow towards the liver. IVC: Not visualized Pancreas: Poorly visualized Spleen: 12.1 cm in length Right Kidney: Length: 12.3 cm. Echogenicity within normal limits. No mass or hydronephrosis visualized. Left Kidney: Length: 12.6 cm.  Echogenicity within normal limits. No mass or hydronephrosis visualized. Abdominal aorta: Not seen proximally due to shadowing bowel gas. No obvious aneurysm. Other findings: None. IMPRESSION: 1. There is a solid 3.6 cm mass in the left hepatic lobe. Recommend an MRI for further assessment. The best quality MRIs are obtained on out patients. If the patient needs immediate imaging of the liver mass, a CT scan would be a reasonable first study. 2. There is a small gallbladder polyp measuring 6.3 mm. Recommend follow-up with ultrasound in 1 year to ensure stability. 3. Probable hepatic steatosis. Electronically Signed   By: Dorise Bullion III M.D   On: 12/14/2017 20:23   Ct Abdomen Pelvis W Contrast  Result Date: 12/14/2017 CLINICAL DATA:  Right upper quadrant pain. Liver mass on ultrasound. EXAM: CT ABDOMEN AND PELVIS WITH CONTRAST TECHNIQUE: Multidetector CT imaging of the abdomen and pelvis was performed using the standard protocol following bolus administration of intravenous contrast. CONTRAST:  131mL ISOVUE-300 IOPAMIDOL (ISOVUE-300) INJECTION 61% COMPARISON:  Right upper quadrant ultrasound earlier this day. CT 01/12/2013 FINDINGS: Lower chest: Lung bases are clear. Hepatobiliary: Generalized hepatic steatosis. The questioned mass on ultrasound corresponds to geographic relative increased density on CT likely focal fatty sparing in the left hepatic lobe adjacent to the falciform ligament. No discrete focal hepatic lesion. Gallbladder is contracted. No pericholecystic inflammation. Pancreas: No ductal dilatation or inflammation. Spleen: Upper normal in size spanning 13.8 cm. Adrenals/Urinary Tract: Adrenal glands are unremarkable. Kidneys are normal, without renal calculi, focal lesion, or hydronephrosis. Bladder is nondistended, normal for degree of bladder distention. Stomach/Bowel: Stomach is within normal limits. Appendix appears normal. No evidence of bowel wall thickening, distention, or inflammatory  changes. Vascular/Lymphatic: No significant vascular findings are present. No enlarged abdominal or pelvic lymph nodes. Reproductive: Prostate is unremarkable. Other: No free air, free fluid, or intra-abdominal fluid collection. 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 There are no acute or suspicious osseous abnormalities. IMPRESSION: 1. Hepatic steatosis. A mass demonstrated on ultrasound represents an area of focal fatty sparing in the left hepatic lobe. No discrete liver lesion. 2. No acute abnormality in the abdomen/pelvis. 3. 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 Electronically Signed   By: Jeb Levering M.D.   On: 12/14/2017 21:47    Procedures Procedures (including critical care time)  Medications Ordered in ED Medications  ondansetron (ZOFRAN) injection 4 mg (4 mg Intravenous Given 12/14/17 1911)  HYDROmorphone (DILAUDID) injection 0.5 mg (0.5 mg Intravenous Given 12/14/17 1912)  iopamidol (ISOVUE-300) 61 % injection 100 mL (100 mLs Intravenous Contrast Given 12/14/17 2058)     Initial Impression / Assessment and Plan / ED Course  I have reviewed the triage vital signs and the nursing notes.  Pertinent labs & imaging results that were available during my care of the patient were reviewed by me and considered  in my medical decision making (see chart for details).    MDM I spoke to Radiologist who advised MRI outpatient for evaluation of liver mass.  Ct scan no acute abnormality  I reviewed ultrasound and ct and discussed results with pt.  I will start pepcid for symptoms.  Pt advised to see Dr. Wolfgang Phoenix for evaluation in 2-3 days.  Final Clinical Impressions(s) / ED Diagnoses   Final diagnoses:  Generalized abdominal pain    ED Discharge Orders        Ordered    famotidine (PEPCID) 20 MG tablet  2 times daily     12/14/17 2206       Sidney Ace 12/14/17  2250    Davonna Belling, MD 12/16/17 [phone]

## 2017-12-14 NOTE — Discharge Instructions (Signed)
Return if any problems,

## 2017-12-14 NOTE — ED Notes (Signed)
Patient transported to CT 

## 2017-12-14 NOTE — ED Triage Notes (Signed)
Patient complains of 1 week of ongoing RUQ  Pain that he describes as a pressure with nausea. No vomiting, worse with food intake

## 2017-12-24 ENCOUNTER — Ambulatory Visit (INDEPENDENT_AMBULATORY_CARE_PROVIDER_SITE_OTHER): Payer: Self-pay | Admitting: Family Medicine

## 2017-12-24 ENCOUNTER — Encounter: Payer: Self-pay | Admitting: Family Medicine

## 2017-12-24 VITALS — BP 122/70 | Temp 98.4°F | Ht 71.0 in | Wt 258.0 lb

## 2017-12-24 DIAGNOSIS — K760 Fatty (change of) liver, not elsewhere classified: Secondary | ICD-10-CM

## 2017-12-24 DIAGNOSIS — R1011 Right upper quadrant pain: Secondary | ICD-10-CM

## 2017-12-24 DIAGNOSIS — R7401 Elevation of levels of liver transaminase levels: Secondary | ICD-10-CM | POA: Insufficient documentation

## 2017-12-24 DIAGNOSIS — R5383 Other fatigue: Secondary | ICD-10-CM

## 2017-12-24 DIAGNOSIS — Z6835 Body mass index (BMI) 35.0-35.9, adult: Secondary | ICD-10-CM

## 2017-12-24 DIAGNOSIS — R740 Nonspecific elevation of levels of transaminase and lactic acid dehydrogenase [LDH]: Secondary | ICD-10-CM

## 2017-12-24 MED ORDER — PANTOPRAZOLE SODIUM 40 MG PO TBEC: 40.0000 mg | DELAYED_RELEASE_TABLET | Freq: Every day | ORAL | 2 refills | Status: DC

## 2017-12-24 NOTE — Progress Notes (Addendum)
   Subjective:    Patient ID: Ryan Carey, male    DOB: Nov 12, 1995, 22 y.o.   MRN: [id number]  Abdominal Pain  This is a new problem. The current episode started in the past 7 days. The onset quality is gradual. The problem occurs intermittently. The problem has been waxing and waning. The pain is located in the RUQ. The pain is at a severity of 6/10. The pain is moderate. The quality of the pain is aching and sharp. The abdominal pain does not radiate. Pertinent negatives include no anorexia, fever, flatus, frequency, headaches, melena, nausea or vomiting. Nothing aggravates the pain. The pain is relieved by nothing. He has tried nothing for the symptoms. The treatment provided no relief.  ED follow up for abdominal pain. Pain in RUQ. Prescribed pepcid and zofran. Pt did not get either one filled. Pt is taking otc ranitidine. Pt states med is not helping.     Review of Systems  Constitutional: Negative for activity change, appetite change, fatigue and fever.  HENT: Negative for congestion.   Respiratory: Negative for cough.   Gastrointestinal: Positive for abdominal pain. Negative for anal bleeding, anorexia, blood in stool, flatus, melena, nausea and vomiting.  Genitourinary: Negative for frequency.  Neurological: Negative for headaches.  Psychiatric/Behavioral: Negative for behavioral problems.       Objective:   Physical Exam  Constitutional: He appears well-nourished. No distress.  HENT:  Head: Normocephalic and atraumatic.  Eyes: Right eye exhibits no discharge. Left eye exhibits no discharge.  Neck: No tracheal deviation present.  Cardiovascular: Normal rate, regular rhythm and normal heart sounds.  No murmur heard. Pulmonary/Chest: Effort normal and breath sounds normal. No respiratory distress. He has no wheezes.  Abdominal: Soft. He exhibits no distension. There is no tenderness. There is no rebound.  Musculoskeletal: He exhibits no edema.  Lymphadenopathy:    He has  no cervical adenopathy.  Neurological: He is alert.  Skin: Skin is warm. No rash noted.  Psychiatric: His behavior is normal.  Vitals reviewed.         Assessment & Plan:  Significant right upper quadrant pressure it is possible that this could be a muscle strain but more likely a inflammation of the intestines go ahead with PPI healthy diet recommended weight reduction recommended  The patient's BMI is calculated.  It is in the vital signs and acknowledged.  It is above the recommended BMI for the patient's height and weight.  The patient has been counseled regarding healthy diet, restricted portions, avoiding excessive carbohydrates/sugary foods, and increase physical activity as health permits.  It is in the patient's best interest to lower the risk of secondary illness including heart disease strokes and cancer by losing weight.  The patient acknowledges this information.  Elevated liver enzymes appropriate workup initiated importance of reducing weight to lessen the risk of cirrhosis recommended  If intestinal symptoms have not improved over the next 2 weeks GI referral

## 2017-12-24 NOTE — Patient Instructions (Signed)

## 2017-12-26 LAB — ANA: Anti Nuclear Antibody(ANA): NEGATIVE

## 2017-12-26 LAB — IRON AND TIBC
Iron Saturation: 24 % (ref 15–55)
Iron: 53 ug/dL (ref 38–169)
Total Iron Binding Capacity: 217 ug/dL — ABNORMAL LOW (ref 250–450)
UIBC: 164 ug/dL (ref 111–343)

## 2017-12-26 LAB — TISSUE TRANSGLUTAMINASE ABS,IGG,IGA
Tissue Transglut Ab: 2 U/mL (ref 0–5)
Transglutaminase IgA: <2 U/mL (ref 0–3)

## 2017-12-26 LAB — FERRITIN: Ferritin: 255 ng/mL (ref 30–400)

## 2017-12-26 LAB — HEPATITIS C ANTIBODY: Hep C Virus Ab: <0.1 s/co ratio (ref 0.0–0.9)

## 2017-12-26 LAB — TSH: TSH: 1.92 u[IU]/mL (ref 0.450–4.500)

## 2017-12-26 LAB — HEPATITIS B SURFACE ANTIGEN: Hepatitis B Surface Ag: NEGATIVE

## 2017-12-26 LAB — CERULOPLASMIN: Ceruloplasmin: 19.2 mg/dL (ref 16.0–31.0)

## 2017-12-28 ENCOUNTER — Encounter: Payer: Self-pay | Admitting: Family Medicine

## 2018-01-09 ENCOUNTER — Ambulatory Visit: Payer: Self-pay | Admitting: Family Medicine

## 2018-04-14 ENCOUNTER — Ambulatory Visit: Payer: BLUE CROSS/BLUE SHIELD | Admitting: Family Medicine

## 2018-04-14 ENCOUNTER — Encounter: Payer: Self-pay | Admitting: Family Medicine

## 2018-04-14 VITALS — BP 110/78 | Temp 98.9°F | Ht 71.0 in | Wt 265.0 lb

## 2018-04-14 DIAGNOSIS — A084 Viral intestinal infection, unspecified: Secondary | ICD-10-CM

## 2018-04-14 MED ORDER — ONDANSETRON HCL 8 MG PO TABS: 8.0000 mg | ORAL_TABLET | Freq: Three times a day (TID) | ORAL | 2 refills | Status: DC | PRN

## 2018-04-14 NOTE — Patient Instructions (Signed)

## 2018-04-14 NOTE — Progress Notes (Signed)
   Subjective:    Patient ID: Ryan Carey, male    DOB: June 23, 1996, 22 y.o.   MRN: [id number]  Abdominal Pain  This is a new problem. The current episode started yesterday. Associated symptoms include diarrhea, headaches, nausea and vomiting. Associated symptoms comments: Light headed. Treatments tried: anti diarrhea med.  Significant nausea along with some intermittent vomiting no diarrhea until earlier today denies high fever chills sweats denies bloody stools    Review of Systems  Gastrointestinal: Positive for abdominal pain, diarrhea, nausea and vomiting.  Neurological: Positive for headaches.   See above    Objective:   Physical Exam  No masses ears normal mucous membranes moist lungs clear no crackles heart regular abdomen soft      Assessment & Plan:  Viral gastroenteritis Supportive measures discussed Zofran as needed Imodium as needed Off work next few days If progressive troubles or if worse to follow-up

## 2018-05-12 ENCOUNTER — Encounter: Payer: Self-pay | Admitting: Family Medicine

## 2018-05-12 ENCOUNTER — Ambulatory Visit: Payer: BLUE CROSS/BLUE SHIELD | Admitting: Family Medicine

## 2018-05-12 VITALS — BP 150/110 | Temp 98.9°F | Ht 71.0 in | Wt 266.0 lb

## 2018-05-12 DIAGNOSIS — A084 Viral intestinal infection, unspecified: Secondary | ICD-10-CM | POA: Diagnosis not present

## 2018-05-12 MED ORDER — SUCRALFATE 1 G PO TABS: ORAL_TABLET | ORAL | 0 refills | Status: DC

## 2018-05-12 MED ORDER — ONDANSETRON HCL 8 MG PO TABS: 8.0000 mg | ORAL_TABLET | Freq: Three times a day (TID) | ORAL | 2 refills | Status: DC | PRN

## 2018-05-12 MED ORDER — DIPHENOXYLATE-ATROPINE 2.5-0.025 MG PO TABS: ORAL_TABLET | ORAL | 0 refills | Status: DC

## 2018-05-12 NOTE — Progress Notes (Signed)
   Subjective:    Patient ID: Ryan Carey, male    DOB: June 08, 1996, 22 y.o.   MRN: [id number]  Diarrhea   This is a new problem. The current episode started in the past 7 days. The problem occurs 5 to 10 times per day. Associated symptoms include abdominal pain and vomiting. Associated symptoms comments: Vomited once yesterday and twice today, sore throat. He has tried nothing for the symptoms.  has reoccurring abdominal pain and heartburn. Woke up on Saturday and had a "sour" stomach, loose stools very often  Protracted course for four days  Sig abd pain   Vomiting substantially and diarrhea, dim enregyu     Diarrhea profuse today   No known exposure  Has tried nootc meds using ccas  immodium equivalent,    +++++++++++++++++++                                   ++++++++++++++                              +  +   +    Review of Systems  Gastrointestinal: Positive for abdominal pain, diarrhea and vomiting.       Objective:   Physical Exam  Alert vitals stable, NAD. Blood pressure good on repeat. HEENT normal. Lungs clear. Heart regular rate and rhythm. Abdomen hyperactive bowel sounds.  Mild epigastric tenderness to deep palpation     Impression gastroenteritis.  Viral versus food borne.  Discussed.  Add Carafate to manage flare of baseline reflux.  Diet discussed.  Lomotil 1 every 6 hours as needed for diarrhea.  Zofran as needed for nausea Assessment & Plan:

## 2018-07-08 ENCOUNTER — Encounter: Payer: Self-pay | Admitting: Family Medicine

## 2018-07-08 ENCOUNTER — Other Ambulatory Visit: Payer: Self-pay | Admitting: Family Medicine

## 2018-07-08 ENCOUNTER — Ambulatory Visit: Payer: BLUE CROSS/BLUE SHIELD | Admitting: Family Medicine

## 2018-07-08 VITALS — BP 144/90 | Temp 98.9°F | Ht 71.0 in | Wt 268.0 lb

## 2018-07-08 DIAGNOSIS — M7581 Other shoulder lesions, right shoulder: Secondary | ICD-10-CM

## 2018-07-08 DIAGNOSIS — R29898 Other symptoms and signs involving the musculoskeletal system: Secondary | ICD-10-CM | POA: Diagnosis not present

## 2018-07-08 DIAGNOSIS — M792 Neuralgia and neuritis, unspecified: Secondary | ICD-10-CM

## 2018-07-08 DIAGNOSIS — M778 Other enthesopathies, not elsewhere classified: Secondary | ICD-10-CM

## 2018-07-08 MED ORDER — PREDNISONE 20 MG PO TABS: ORAL_TABLET | ORAL | 0 refills | Status: DC

## 2018-07-08 NOTE — Progress Notes (Signed)
   Subjective:    Patient ID: Ryan Carey, male    DOB: Mar 22, 1996, 22 y.o.   MRN: [id number]  Shoulder Pain   Pain location: both shoulders but right shoulder is worse. This is a new problem. Episode onset: 6 days. There has been no history of extremity trauma. The problem occurs constantly. Associated symptoms comments: Nausea and vomiting from the pain, tingling in right fingers. Treatments tried: aleve. The treatment provided no relief.      Review of Systems  Constitutional: Negative for activity change.  HENT: Negative for congestion and rhinorrhea.   Respiratory: Negative for cough and shortness of breath.   Cardiovascular: Negative for chest pain.  Gastrointestinal: Negative for abdominal pain, diarrhea, nausea and vomiting.  Genitourinary: Negative for dysuria and hematuria.  Musculoskeletal: Positive for arthralgias.  Neurological: Negative for weakness and headaches.  Psychiatric/Behavioral: Negative for behavioral problems and confusion.       Objective:   Physical Exam  Constitutional: He appears well-nourished. No distress.  HENT:  Head: Normocephalic and atraumatic.  Eyes: Right eye exhibits no discharge. Left eye exhibits no discharge.  Neck: No tracheal deviation present.  Cardiovascular: Normal rate, regular rhythm and normal heart sounds.  No murmur heard. Pulmonary/Chest: Effort normal and breath sounds normal. No respiratory distress.  Musculoskeletal: He exhibits no edema.  Lymphadenopathy:    He has no cervical adenopathy.  Neurological: He is alert. Coordination normal.  Skin: Skin is warm and dry.  Psychiatric: He has a normal mood and affect. His behavior is normal.  Vitals reviewed.  Significant shoulder pain and discomfort hurts with rotation and extension.  Still with rotator cuff tendinitis  Patient also has intermittent right arm pain as well as hand numbness although I did not appreciate any major issues today please start what the  patient is stating he relates a lot of weakness and clumsiness with his hand and intermittent numbness.  Works with his hands for living.     Assessment & Plan:  Shoulder pain discomfort Range of motion exercises Prednisone taper   Arm pain and hand weakness referral to hand specialist for further evaluation.

## 2018-07-08 NOTE — Progress Notes (Signed)
Referral placed and pt notified

## 2018-07-22 ENCOUNTER — Encounter: Payer: Self-pay | Admitting: Neurology

## 2018-07-22 DIAGNOSIS — M79641 Pain in right hand: Secondary | ICD-10-CM | POA: Diagnosis not present

## 2018-07-22 DIAGNOSIS — M5412 Radiculopathy, cervical region: Secondary | ICD-10-CM | POA: Diagnosis not present

## 2018-07-22 DIAGNOSIS — G5603 Carpal tunnel syndrome, bilateral upper limbs: Secondary | ICD-10-CM | POA: Diagnosis not present

## 2018-07-24 ENCOUNTER — Other Ambulatory Visit: Payer: Self-pay | Admitting: *Deleted

## 2018-07-24 DIAGNOSIS — G5603 Carpal tunnel syndrome, bilateral upper limbs: Secondary | ICD-10-CM

## 2018-08-13 ENCOUNTER — Ambulatory Visit (INDEPENDENT_AMBULATORY_CARE_PROVIDER_SITE_OTHER): Payer: BLUE CROSS/BLUE SHIELD | Admitting: Neurology

## 2018-08-13 DIAGNOSIS — G5601 Carpal tunnel syndrome, right upper limb: Secondary | ICD-10-CM

## 2018-08-13 DIAGNOSIS — G5622 Lesion of ulnar nerve, left upper limb: Secondary | ICD-10-CM

## 2018-08-13 DIAGNOSIS — G5603 Carpal tunnel syndrome, bilateral upper limbs: Secondary | ICD-10-CM

## 2018-08-13 NOTE — Progress Notes (Signed)
Nerve conduction testing is showing some carpal tunnel as well as ulnar nerve impingement very important to go back to hand specialist Dr. Levell July office The patient should connect with their office to request a follow-up appointment If he is having any difficulty to let us know Please make sure Danae Chen sends a copy of this patient's nerve conduction test to Dr. Levell July office

## 2018-08-13 NOTE — Progress Notes (Signed)
Patient states he has an appt with Dr.Kuzma on 08/17/2018.Please send this information to [place] office.
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
## 2018-08-17 DIAGNOSIS — M5412 Radiculopathy, cervical region: Secondary | ICD-10-CM | POA: Diagnosis not present

## 2018-08-17 DIAGNOSIS — G5603 Carpal tunnel syndrome, bilateral upper limbs: Secondary | ICD-10-CM | POA: Diagnosis not present

## 2018-09-28 ENCOUNTER — Other Ambulatory Visit: Payer: Self-pay | Admitting: Family Medicine

## 2018-09-28 ENCOUNTER — Encounter: Payer: Self-pay | Admitting: Family Medicine

## 2018-09-28 ENCOUNTER — Ambulatory Visit: Payer: BLUE CROSS/BLUE SHIELD | Admitting: Family Medicine

## 2018-09-28 VITALS — BP 148/102 | Ht 71.0 in | Wt 276.0 lb

## 2018-09-28 DIAGNOSIS — G4730 Sleep apnea, unspecified: Secondary | ICD-10-CM | POA: Diagnosis not present

## 2018-09-28 DIAGNOSIS — J029 Acute pharyngitis, unspecified: Secondary | ICD-10-CM | POA: Diagnosis not present

## 2018-09-28 DIAGNOSIS — Z13220 Encounter for screening for lipoid disorders: Secondary | ICD-10-CM

## 2018-09-28 DIAGNOSIS — K219 Gastro-esophageal reflux disease without esophagitis: Secondary | ICD-10-CM

## 2018-09-28 DIAGNOSIS — I10 Essential (primary) hypertension: Secondary | ICD-10-CM

## 2018-09-28 LAB — POCT RAPID STREP A (OFFICE): Rapid Strep A Screen: NEGATIVE

## 2018-09-28 MED ORDER — LISINOPRIL 5 MG PO TABS: 5.0000 mg | ORAL_TABLET | Freq: Every day | ORAL | 6 refills | Status: DC

## 2018-09-28 MED ORDER — ESOMEPRAZOLE MAGNESIUM 40 MG PO CPDR: 40.0000 mg | DELAYED_RELEASE_CAPSULE | Freq: Every day | ORAL | 3 refills | Status: DC

## 2018-09-28 MED ORDER — SUCRALFATE 1 G PO TABS: ORAL_TABLET | ORAL | 0 refills | Status: DC

## 2018-09-28 NOTE — Telephone Encounter (Signed)
So at this point rather than just sending in random prescriptions we need to find out what does his insurance cover?  Thank you

## 2018-09-28 NOTE — Patient Instructions (Signed)
DASH Eating Plan DASH stands for "Dietary Approaches to Stop Hypertension." The DASH eating plan is a healthy eating plan that has been shown to reduce high blood pressure (hypertension). It may also reduce your risk for type 2 diabetes, heart disease, and stroke. The DASH eating plan may also help with weight loss. What are tips for following this plan? General guidelines  Avoid eating more than 2,300 mg (milligrams) of salt (sodium) a day. If you have hypertension, you may need to reduce your sodium intake to 1,500 mg a day.  Limit alcohol intake to no more than 1 drink a day for nonpregnant women and 2 drinks a day for men. One drink equals 12 oz of beer, 5 oz of wine, or 1 oz of hard liquor.  Work with your health care provider to maintain a healthy body weight or to lose weight. Ask what an ideal weight is for you.  Get at least 30 minutes of exercise that causes your heart to beat faster (aerobic exercise) most days of the week. Activities may include walking, swimming, or biking.  Work with your health care provider or diet and nutrition specialist (dietitian) to adjust your eating plan to your individual calorie needs. Reading food labels  Check food labels for the amount of sodium per serving. Choose foods with less than 5 percent of the Daily Value of sodium. Generally, foods with less than 300 mg of sodium per serving fit into this eating plan.  To find whole grains, look for the word "whole" as the first word in the ingredient list. Shopping  Buy products labeled as "low-sodium" or "no salt added."  Buy fresh foods. Avoid canned foods and premade or frozen meals. Cooking  Avoid adding salt when cooking. Use salt-free seasonings or herbs instead of table salt or sea salt. Check with your health care provider or pharmacist before using salt substitutes.  Do not fry foods. Cook foods using healthy methods such as baking, boiling, grilling, and broiling instead.  Cook with  heart-healthy oils, such as olive, canola, soybean, or sunflower oil. Meal planning   Eat a balanced diet that includes: ? 5 or more servings of fruits and vegetables each day. At each meal, try to fill half of your plate with fruits and vegetables. ? Up to 6-8 servings of whole grains each day. ? Less than 6 oz of lean meat, poultry, or fish each day. A 3-oz serving of meat is about the same size as a deck of cards. One egg equals 1 oz. ? 2 servings of low-fat dairy each day. ? A serving of nuts, seeds, or beans 5 times each week. ? Heart-healthy fats. Healthy fats called Omega-3 fatty acids are found in foods such as flaxseeds and coldwater fish, like sardines, salmon, and mackerel.  Limit how much you eat of the following: ? Canned or prepackaged foods. ? Food that is high in trans fat, such as fried foods. ? Food that is high in saturated fat, such as fatty meat. ? Sweets, desserts, sugary drinks, and other foods with added sugar. ? Full-fat dairy products.  Do not salt foods before eating.  Try to eat at least 2 vegetarian meals each week.  Eat more home-cooked food and less restaurant, buffet, and fast food.  When eating at a restaurant, ask that your food be prepared with less salt or no salt, if possible. What foods are recommended? The items listed may not be a complete list. Talk with your dietitian about what   dietary choices are best for you. Grains Whole-grain or whole-wheat bread. Whole-grain or whole-wheat pasta. Brown rice. Oatmeal. Quinoa. Bulgur. Whole-grain and low-sodium cereals. Pita bread. Low-fat, low-sodium crackers. Whole-wheat flour tortillas. Vegetables Fresh or frozen vegetables (raw, steamed, roasted, or grilled). Low-sodium or reduced-sodium tomato and vegetable juice. Low-sodium or reduced-sodium tomato sauce and tomato paste. Low-sodium or reduced-sodium canned vegetables. Fruits All fresh, dried, or frozen fruit. Canned fruit in natural juice (without  added sugar). Meat and other protein foods Skinless chicken or turkey. Ground chicken or turkey. Pork with fat trimmed off. Fish and seafood. Egg whites. Dried beans, peas, or lentils. Unsalted nuts, nut butters, and seeds. Unsalted canned beans. Lean cuts of beef with fat trimmed off. Low-sodium, lean deli meat. Dairy Low-fat (1%) or fat-free (skim) milk. Fat-free, low-fat, or reduced-fat cheeses. Nonfat, low-sodium ricotta or cottage cheese. Low-fat or nonfat yogurt. Low-fat, low-sodium cheese. Fats and oils Soft margarine without trans fats. Vegetable oil. Low-fat, reduced-fat, or light mayonnaise and salad dressings (reduced-sodium). Canola, safflower, olive, soybean, and sunflower oils. Avocado. Seasoning and other foods Herbs. Spices. Seasoning mixes without salt. Unsalted popcorn and pretzels. Fat-free sweets. What foods are not recommended? The items listed may not be a complete list. Talk with your dietitian about what dietary choices are best for you. Grains Baked goods made with fat, such as croissants, muffins, or some breads. Dry pasta or rice meal packs. Vegetables Creamed or fried vegetables. Vegetables in a cheese sauce. Regular canned vegetables (not low-sodium or reduced-sodium). Regular canned tomato sauce and paste (not low-sodium or reduced-sodium). Regular tomato and vegetable juice (not low-sodium or reduced-sodium). Pickles. Olives. Fruits Canned fruit in a light or heavy syrup. Fried fruit. Fruit in cream or butter sauce. Meat and other protein foods Fatty cuts of meat. Ribs. Fried meat. Bacon. Sausage. Bologna and other processed lunch meats. Salami. Fatback. Hotdogs. Bratwurst. Salted nuts and seeds. Canned beans with added salt. Canned or smoked fish. Whole eggs or egg yolks. Chicken or turkey with skin. Dairy Whole or 2% milk, cream, and half-and-half. Whole or full-fat cream cheese. Whole-fat or sweetened yogurt. Full-fat cheese. Nondairy creamers. Whipped toppings.  Processed cheese and cheese spreads. Fats and oils Butter. Stick margarine. Lard. Shortening. Ghee. Bacon fat. Tropical oils, such as coconut, palm kernel, or palm oil. Seasoning and other foods Salted popcorn and pretzels. Onion salt, garlic salt, seasoned salt, table salt, and sea salt. Worcestershire sauce. Tartar sauce. Barbecue sauce. Teriyaki sauce. Soy sauce, including reduced-sodium. Steak sauce. Canned and packaged gravies. Fish sauce. Oyster sauce. Cocktail sauce. Horseradish that you find on the shelf. Ketchup. Mustard. Meat flavorings and tenderizers. Bouillon cubes. Hot sauce and Tabasco sauce. Premade or packaged marinades. Premade or packaged taco seasonings. Relishes. Regular salad dressings. Where to find more information:  National Heart, Lung, and Blood Institute: www.nhlbi.nih.gov  American Heart Association: www.heart.org Summary  The DASH eating plan is a healthy eating plan that has been shown to reduce high blood pressure (hypertension). It may also reduce your risk for type 2 diabetes, heart disease, and stroke.  With the DASH eating plan, you should limit salt (sodium) intake to 2,300 mg a day. If you have hypertension, you may need to reduce your sodium intake to 1,500 mg a day.  When on the DASH eating plan, aim to eat more fresh fruits and vegetables, whole grains, lean proteins, low-fat dairy, and heart-healthy fats.  Work with your health care provider or diet and nutrition specialist (dietitian) to adjust your eating plan to your individual   calorie needs. This information is not intended to replace advice given to you by your health care provider. Make sure you discuss any questions you have with your health care provider. Document Released: 10/03/2011 Document Revised: 10/07/2016 Document Reviewed: 10/07/2016 Elsevier Interactive Patient Education  2018 Elsevier Inc.  

## 2018-09-28 NOTE — Progress Notes (Signed)
   Subjective:    Patient ID: Ryan Carey, male    DOB: 1996/09/04, 22 y.o.   MRN: [id number]  HPIwants to discuss possible sleep apnea. Snores, tosses and turns all night, feels like he has not slept at all when he wakes up.  He finds himself very sleepy during the day at times gets drowsy when he is driving no accidents or injuries his wife states that he snores severely and is quite restless  protonix not helping with reflux and his insurance is not going to cover that med in January.  Patient has frequent reflux symptoms with burning in the esophagus  bp was elevated at a health clinic at work. 162/105.  Pt has been checking since and it has been consisently high.  Patient does a lot of walking at work but does not do any cardio workouts does want to join a gym in January and start doing some walking  Patient with mild respiratory symptoms of sore throat postnasal drainage symptoms over the past few days Review of Systems  Constitutional: Negative for diaphoresis and fatigue.  HENT: Negative for congestion and rhinorrhea.   Respiratory: Negative for cough and shortness of breath.   Cardiovascular: Negative for chest pain and leg swelling.  Gastrointestinal: Negative for abdominal pain and diarrhea.  Skin: Negative for color change and rash.  Neurological: Negative for dizziness and headaches.  Psychiatric/Behavioral: Negative for behavioral problems and confusion.       Objective:   Physical Exam  Constitutional: He appears well-nourished. No distress.  HENT:  Head: Normocephalic and atraumatic.  Eyes: Right eye exhibits no discharge. Left eye exhibits no discharge.  Neck: No tracheal deviation present.  Cardiovascular: Normal rate, regular rhythm and normal heart sounds.  No murmur heard. Pulmonary/Chest: Effort normal and breath sounds normal. No respiratory distress.  Musculoskeletal: He exhibits no edema.  Lymphadenopathy:    He has no cervical adenopathy.    Neurological: He is alert. Coordination normal.  Skin: Skin is warm and dry.  Psychiatric: He has a normal mood and affect. His behavior is normal.  Vitals reviewed.         Assessment & Plan:  Sleep apnea-we need to go ahead and do a sleep study [place] questionnaire positive for sleep apnea Patient has significant upper airway body habitus changes consistent with sleep apnea plus also his history for sleep apnea is very dominant  HTN subpar control DASHdiet along with exercise plus also lisinopril 5 mg daily In addition to this try to lose some weight  Viral URI with sore throat rapid strep test negative supportive measures discussed  GERD-lifestyle measures including putting that on 6 inch blocks Dietary measures discussed Current medication not covered therefore Nexium was sent in  The patient will need to do a follow-up office visit in 6 months  Follow-up of blood pressure in 4 to 6 weeks  Sleep study ordered await the results  25 minutes was spent with the patient.  This statement verifies that 25 minutes was indeed spent with the patient.  More than 50% of this visit-total duration of the visit-was spent in counseling and coordination of care. The issues that the patient came in for today as reflected in the diagnosis (s) please refer to documentation for further details.

## 2018-09-29 ENCOUNTER — Other Ambulatory Visit: Payer: Self-pay | Admitting: *Deleted

## 2018-09-29 LAB — STREP A DNA PROBE: Strep Gp A Direct, DNA Probe: NEGATIVE

## 2018-09-29 LAB — SPECIMEN STATUS REPORT

## 2018-09-29 MED ORDER — OMEPRAZOLE 40 MG PO CPDR: 40.0000 mg | DELAYED_RELEASE_CAPSULE | Freq: Every day | ORAL | 1 refills | Status: DC

## 2018-09-29 NOTE — Telephone Encounter (Signed)
Contacted pharmacy. Pharmacy states that omeprazole or Protonix should be covered.

## 2018-09-29 NOTE — Telephone Encounter (Signed)
Called pt and let him know of change of med and omeprazole sent to pharm.

## 2018-09-29 NOTE — Telephone Encounter (Signed)
Go with omeprazole 40 mg 1 daily, #90, 1 refill

## 2018-11-02 ENCOUNTER — Ambulatory Visit: Payer: BLUE CROSS/BLUE SHIELD | Admitting: Family Medicine

## 2018-12-02 ENCOUNTER — Ambulatory Visit: Payer: BLUE CROSS/BLUE SHIELD | Admitting: Family Medicine

## 2019-04-06 ENCOUNTER — Other Ambulatory Visit: Payer: Self-pay

## 2019-04-06 ENCOUNTER — Emergency Department (HOSPITAL_BASED_OUTPATIENT_CLINIC_OR_DEPARTMENT_OTHER)
Admission: EM | Admit: 2019-04-06 | Discharge: 2019-04-06 | Disposition: A | Discharge status: Home / Self Care | Payer: Self-pay | Attending: Emergency Medicine | Admitting: Emergency Medicine

## 2019-04-06 ENCOUNTER — Encounter (HOSPITAL_BASED_OUTPATIENT_CLINIC_OR_DEPARTMENT_OTHER): Payer: Self-pay | Admitting: Emergency Medicine

## 2019-04-06 DIAGNOSIS — Z79899 Other long term (current) drug therapy: Secondary | ICD-10-CM | POA: Insufficient documentation

## 2019-04-06 DIAGNOSIS — Z23 Encounter for immunization: Secondary | ICD-10-CM | POA: Insufficient documentation

## 2019-04-06 DIAGNOSIS — S61213A Laceration without foreign body of left middle finger without damage to nail, initial encounter: Secondary | ICD-10-CM | POA: Insufficient documentation

## 2019-04-06 DIAGNOSIS — Y929 Unspecified place or not applicable: Secondary | ICD-10-CM | POA: Insufficient documentation

## 2019-04-06 DIAGNOSIS — W260XXA Contact with knife, initial encounter: Secondary | ICD-10-CM | POA: Insufficient documentation

## 2019-04-06 DIAGNOSIS — Z87891 Personal history of nicotine dependence: Secondary | ICD-10-CM | POA: Insufficient documentation

## 2019-04-06 DIAGNOSIS — Y990 Civilian activity done for income or pay: Secondary | ICD-10-CM | POA: Insufficient documentation

## 2019-04-06 DIAGNOSIS — Y939 Activity, unspecified: Secondary | ICD-10-CM | POA: Insufficient documentation

## 2019-04-06 MED ORDER — TETANUS-DIPHTH-ACELL PERTUSSIS 5-2.5-18.5 LF-MCG/0.5 IM SUSP
0.5000 mL | Freq: Once | INTRAMUSCULAR | Status: AC
  Administered 2019-04-06: 0.5 mL via INTRAMUSCULAR
  Filled 2019-04-06: qty 0.5

## 2019-04-06 NOTE — ED Notes (Signed)
Left hand soaking in sterile water and betadine. Small laceration top of 3rd digit.

## 2019-04-06 NOTE — ED Provider Notes (Signed)
[facility]ERGENC[facility] DEPARTMENT Provider Note   CSN: [id number] Arrival date & time: 04/06/19  0006    History   Chief Complaint Chief Complaint  Patient presents with  . Laceration    HPI Ryan Carey is a 23 y.o. male.     The history is provided by the patient.  Laceration  Location:  Hand Hand laceration location:  L fingers Length:  1 Depth:  Through dermis Quality: straight   Bleeding: uncontrolled   Laceration mechanism:  Knife Pain details:    Quality:  Aching   Severity:  Mild   Progression:  Unchanged Foreign body present:  No foreign bodies Relieved by:  Nothing Worsened by:  Nothing Ineffective treatments:  None tried Tetanus status:  Unknown Associated symptoms: no fever   Laceration to the dorsal base of the left middle finger  Past Medical History:  Diagnosis Date  . Chronic headaches   . Enuresis     Patient Active Problem List   Diagnosis Date Noted  . Elevated transaminase level 12/24/2017  . BMI 35.0-35.9,adult 12/24/2017  . Fatty liver 12/24/2017  . 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 No pertinent surgical history.      Home Medications    Prior to Admission medications   Medication Sig Start Date End Date Taking? Authorizing Provider  lisinopril (PRINIVIL,ZESTRIL) 5 MG tablet Take 1 tablet (5 mg total) by mouth daily. 09/28/18   Kathyrn Drown, MD  omeprazole (PRILOSEC) 40 MG capsule Take 1 capsule (40 mg total) by mouth daily. 09/29/18   Kathyrn Drown, MD  ondansetron (ZOFRAN) 8 MG tablet Take 1 tablet (8 mg total) by mouth every 8 (eight) hours as needed for nausea. Patient not taking: Reported on 07/08/2018 05/12/18   Mikey Kirschner, MD  sucralfate (CARAFATE) 1 g tablet Dissolve one tablet in one ounce of  water before meals 09/28/18   Kathyrn Drown, MD    Family History No family history on file.  Social History Social History   Tobacco Use  . Smoking status: Never Smoker  . Smokeless  tobacco: Former Systems developer    Types: Snuff  Substance Use Topics  . Alcohol use: Yes    Comment: occ  . Drug use: No     Allergies   Codeine   Review of Systems Review of Systems  Constitutional: Negative for fever.     Physical Exam Updated Vital Signs BP (!) 150/95 (BP Location: Right Arm)   Pulse 88   Temp 98.2 F (36.8 C) (Oral)   Resp 20   Ht 6' (1.829 m)   Wt 128.8 kg   SpO2 98%   BMI 38.52 kg/m   Physical Exam Vitals signs and nursing note reviewed.  Constitutional:      General: He is not in acute distress.    Appearance: He is normal weight.  HENT:     Head: Normocephalic and atraumatic.     Nose: Nose normal.  Eyes:     Conjunctiva/sclera: Conjunctivae normal.     Pupils: Pupils are equal, round, and reactive to light.  Neck:     Musculoskeletal: Normal range of motion and neck supple.  Cardiovascular:     Rate and Rhythm: Normal rate and regular rhythm.     Pulses: Normal pulses.     Heart sounds: Normal heart sounds.  Pulmonary:     Effort: Pulmonary effort is normal.     Breath sounds: Normal breath sounds.  Abdominal:  General: Abdomen is flat. Bowel sounds are normal.  Musculoskeletal: Normal range of motion.     Left hand: He exhibits laceration. Normal sensation noted. Normal strength noted.       Hands:  Skin:    General: Skin is warm and dry.     Capillary Refill: Capillary refill takes less than 2 seconds.  Neurological:     General: No focal deficit present.     Mental Status: He is alert and oriented to person, place, and time.  Psychiatric:        Mood and Affect: Mood normal.        Behavior: Behavior normal.      ED Treatments / Results  Labs (all labs ordered are listed, but only abnormal results are displayed) Labs Reviewed - No data to display  EKG None  Radiology No results found.  Procedures .Marland KitchenLaceration Repair Date/Time: 04/06/2019 1:30 AM Performed by: Veatrice Kells, MD Authorized by: Veatrice Kells, MD    Consent:    Consent obtained:  Verbal   Consent given by:  Patient   Risks discussed:  Infection, need for additional repair, nerve damage, poor wound healing, poor cosmetic result, pain and vascular damage   Alternatives discussed:  No treatment Anesthesia (see MAR for exact dosages):    Anesthesia method:  None Laceration details:    Location:  Finger   Finger location:  L long finger   Length (cm):  1   Depth (mm):  1 Repair type:    Repair type:  Simple Pre-procedure details:    Preparation:  Patient was prepped and draped in usual sterile fashion Exploration:    Hemostasis achieved with:  Direct pressure   Wound exploration: wound explored through full range of motion     Wound extent: no areolar tissue violation noted     Contaminated: no   Treatment:    Area cleansed with:  Betadine, Hibiclens and saline   Amount of cleaning:  Extensive Skin repair:    Repair method:  Tissue adhesive Approximation:    Approximation:  Close Post-procedure details:    Dressing:  Open (no dressing)   Patient tolerance of procedure:  Tolerated well, no immediate complications   (including critical care time)  Medications Ordered in ED Medications  Tdap (BOOSTRIX) injection 0.5 mL (0.5 mLs Intramuscular Given 04/06/19 0101)      Final Clinical Impressions(s) / ED Diagnoses   Final diagnoses:  Laceration of left middle finger without foreign body without damage to nail, initial encounter    Return for intractable cough, coughing up blood,fevers >100.4 unrelieved by medication, shortness of breath, intractable vomiting, chest pain, shortness of breath, weakness,numbness, changes in speech, facial asymmetry,abdominal pain, passing out,Inability to tolerate liquids or food, cough, altered mental status or any concerns. No signs of systemic illness or infection. The patient is nontoxic-appearing on exam and vital signs are within normal limits.   I have reviewed the triage  vital signs and the nursing notes. Pertinent labs &imaging results that were available during my care of the patient were reviewed by me and considered in my medical decision making (see chart for details).  After history, exam, and medical workup I feel the patient has been appropriately medically screened and is safe for discharge home. Pertinent diagnoses were discussed with the patient. Patient was given return precautions   Indi Willhite, MD 04/06/19 [id number]

## 2019-04-06 NOTE — ED Notes (Signed)
ED Provider at bedside. 

## 2019-04-06 NOTE — ED Triage Notes (Signed)
Cut  Left hand 3rd digit at work. Bleeding controlled.

## 2019-05-24 ENCOUNTER — Telehealth: Payer: Self-pay | Admitting: Family Medicine

## 2019-05-24 NOTE — Telephone Encounter (Signed)
I looked under care everywhere and I find no evidence that does not mean it was not done , often care everywhere does not update until we see the patient face-to-face Please try to track down results thank you

## 2019-05-24 NOTE — Telephone Encounter (Signed)
7/9 pt was tested for Covid and has not received. He had the test done at [facility]. He called them and they informed him we have the results

## 2019-05-24 NOTE — Telephone Encounter (Signed)
Not in epic where I can see. Can you find results under care everywhere or do I need to call and get results.

## 2019-05-24 NOTE — Telephone Encounter (Signed)
So noted thank you 

## 2019-05-24 NOTE — Telephone Encounter (Signed)
Called pt to see where he had test done and he then told me he found a number to call to get results after he left message here and he was told it was negative. States they did not call him since it was negative but he was out of work until he heard back on results and the hospital is giving him a work excuse he is going to pick it up now. He never had any symptoms and he does not need anything at this time from our office.

## 2019-05-25 ENCOUNTER — Other Ambulatory Visit: Payer: Self-pay

## 2019-05-26 ENCOUNTER — Ambulatory Visit: Payer: Self-pay | Admitting: Family Medicine

## 2019-07-29 ENCOUNTER — Telehealth: Payer: Self-pay | Admitting: *Deleted

## 2019-07-29 NOTE — Telephone Encounter (Signed)
Fax from cover my meds requesting a PA for nexium 40mg  capsules. I looked on med list and this med is not listed. Chart has him taking omeprazole. Left message with pt to clarify.

## 2019-08-05 NOTE — Telephone Encounter (Signed)
Left message to return call 

## 2019-08-06 NOTE — Telephone Encounter (Signed)
Left message to return call 

## 2019-08-12 ENCOUNTER — Other Ambulatory Visit: Payer: Self-pay | Admitting: *Deleted

## 2019-08-12 MED ORDER — OMEPRAZOLE 40 MG PO CPDR: 40.0000 mg | DELAYED_RELEASE_CAPSULE | Freq: Every day | ORAL | 0 refills | Status: DC

## 2019-08-12 NOTE — Telephone Encounter (Signed)
Pt states he is not taking nexium. He is taking omeprazole and he needs a refill. Refill sent per dr Nicki Reaper.

## 2019-10-21 ENCOUNTER — Other Ambulatory Visit: Payer: Self-pay | Admitting: Family Medicine

## 2019-10-29 HISTORY — PX: CHOLECYSTECTOMY: SHX55

## 2019-11-20 ENCOUNTER — Emergency Department (HOSPITAL_COMMUNITY)
Admission: EM | Admit: 2019-11-20 | Discharge: 2019-11-20 | Disposition: A | Discharge status: Home / Self Care | Payer: Managed Care, Other (non HMO) | Attending: Emergency Medicine | Admitting: Emergency Medicine

## 2019-11-20 ENCOUNTER — Other Ambulatory Visit: Payer: Self-pay

## 2019-11-20 ENCOUNTER — Encounter (HOSPITAL_COMMUNITY): Payer: Self-pay | Admitting: Emergency Medicine

## 2019-11-20 ENCOUNTER — Emergency Department (HOSPITAL_COMMUNITY): Payer: Managed Care, Other (non HMO)

## 2019-11-20 DIAGNOSIS — R7401 Elevation of levels of liver transaminase levels: Secondary | ICD-10-CM

## 2019-11-20 DIAGNOSIS — Z79899 Other long term (current) drug therapy: Secondary | ICD-10-CM | POA: Diagnosis not present

## 2019-11-20 DIAGNOSIS — R1011 Right upper quadrant pain: Secondary | ICD-10-CM | POA: Diagnosis present

## 2019-11-20 DIAGNOSIS — R109 Unspecified abdominal pain: Secondary | ICD-10-CM

## 2019-11-20 LAB — CBC WITH DIFFERENTIAL/PLATELET
Abs Immature Granulocytes: 0.03 10*3/uL (ref 0.00–0.07)
Basophils Absolute: 0.1 10*3/uL (ref 0.0–0.1)
Basophils Relative: 1 %
Eosinophils Absolute: 0.5 10*3/uL (ref 0.0–0.5)
Eosinophils Relative: 6 %
HCT: 42.1 % (ref 39.0–52.0)
Hemoglobin: 13.6 g/dL (ref 13.0–17.0)
Immature Granulocytes: 0 %
Lymphocytes Relative: 34 %
Lymphs Abs: 3.3 10*3/uL (ref 0.7–4.0)
MCH: 26.6 pg (ref 26.0–34.0)
MCHC: 32.3 g/dL (ref 30.0–36.0)
MCV: 82.2 fL (ref 80.0–100.0)
Monocytes Absolute: 0.7 10*3/uL (ref 0.1–1.0)
Monocytes Relative: 7 %
Neutro Abs: 5 10*3/uL (ref 1.7–7.7)
Neutrophils Relative %: 52 %
Platelets: 275 10*3/uL (ref 150–400)
RBC: 5.12 MIL/uL (ref 4.22–5.81)
RDW: 13.4 % (ref 11.5–15.5)
WBC: 9.6 10*3/uL (ref 4.0–10.5)
nRBC: 0 % (ref 0.0–0.2)

## 2019-11-20 LAB — COMPREHENSIVE METABOLIC PANEL
ALT: 109 U/L — ABNORMAL HIGH (ref 0–44)
AST: 42 U/L — ABNORMAL HIGH (ref 15–41)
Albumin: 3.9 g/dL (ref 3.5–5.0)
Alkaline Phosphatase: 48 U/L (ref 38–126)
Anion gap: 9 (ref 5–15)
BUN: 18 mg/dL (ref 6–20)
CO2: 27 mmol/L (ref 22–32)
Calcium: 8.9 mg/dL (ref 8.9–10.3)
Chloride: 101 mmol/L (ref 98–111)
Creatinine, Ser: 0.67 mg/dL (ref 0.61–1.24)
GFR calc Af Amer: >60 mL/min (ref >60)
GFR calc non Af Amer: >60 mL/min (ref >60)
Glucose, Bld: 125 mg/dL — ABNORMAL HIGH (ref 70–99)
Potassium: 4 mmol/L (ref 3.5–5.1)
Sodium: 137 mmol/L (ref 135–145)
Total Bilirubin: 0.8 mg/dL (ref 0.3–1.2)
Total Protein: 7.8 g/dL (ref 6.5–8.1)

## 2019-11-20 LAB — URINALYSIS, ROUTINE W REFLEX MICROSCOPIC
Bilirubin Urine: NEGATIVE
Glucose, UA: NEGATIVE mg/dL
Hgb urine dipstick: NEGATIVE
Ketones, ur: NEGATIVE mg/dL
Leukocytes,Ua: NEGATIVE
Nitrite: NEGATIVE
Protein, ur: NEGATIVE mg/dL
Specific Gravity, Urine: 1.02 (ref 1.005–1.030)
pH: 7 (ref 5.0–8.0)

## 2019-11-20 LAB — LIPASE, BLOOD: Lipase: 22 U/L (ref 11–51)

## 2019-11-20 MED ORDER — HYDROCODONE-ACETAMINOPHEN 5-325 MG PO TABS: 1.0000 | ORAL_TABLET | ORAL | 0 refills | Status: DC | PRN

## 2019-11-20 MED ORDER — ONDANSETRON HCL 4 MG/2ML IJ SOLN
4.0000 mg | Freq: Once | INTRAMUSCULAR | Status: AC
  Administered 2019-11-20: 4 mg via INTRAVENOUS
  Filled 2019-11-20: qty 2

## 2019-11-20 MED ORDER — MORPHINE SULFATE (PF) 4 MG/ML IV SOLN
4.0000 mg | Freq: Once | INTRAVENOUS | Status: AC
  Administered 2019-11-20: 09:00:00 4 mg via INTRAVENOUS
  Filled 2019-11-20: qty 1

## 2019-11-20 MED ORDER — ONDANSETRON 4 MG PO TBDP: 4.0000 mg | ORAL_TABLET | Freq: Three times a day (TID) | ORAL | 0 refills | Status: DC | PRN

## 2019-11-20 NOTE — ED Notes (Signed)
ED Provider at bedside. 

## 2019-11-20 NOTE — ED Triage Notes (Signed)
Pt reports Sunday he began having abd pain with vomiting and diarrhea. Had televisit and was taken out of work. Continues to have right upper quad pain with nausea and dry heaves at times.

## 2019-11-20 NOTE — ED Provider Notes (Signed)
[facility] EMERGENCY DEPARTMENT Provider Note   CSN: [id number] Arrival date & time: 11/20/19  [id number]     History Chief Complaint  Patient presents with  . Abdominal Pain    Ryan Carey is a 24 y.o. male presenting with waxing and waning RUQ abdominal pain for the past 6 days in association with nausea with dry heaves.  He also had a 2 day history of diarrhea which has been resolved for the past 4 days.  There is no radiation of pain which is deep and aching in quality.  He has found no alleviators for his sx.  He has found no pattern to worsened episodes, not specifically triggered by food intake.  He denies fevers, chills, sob, cp.  Past hx of GERD, currently on an otc ppi.  The history is provided by the patient.       Past Medical History:  Diagnosis Date  . Chronic headaches   . Enuresis     Patient Active Problem List   Diagnosis Date Noted  . Elevated transaminase level 12/24/2017  . BMI 35.0-35.9,adult 12/24/2017  . Fatty liver 12/24/2017  . 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 No pertinent surgical history.     History reviewed. No pertinent family history.  Social History   Tobacco Use  . Smoking status: Never Smoker  . Smokeless tobacco: Former Systems developer    Types: Snuff  Substance Use Topics  . Alcohol use: Yes    Comment: occ  . Drug use: No    Home Medications Prior to Admission medications   Medication Sig Start Date End Date Taking? Authorizing Provider  esomeprazole (NEXIUM) 40 MG capsule Take 40 mg by mouth daily. 09/15/19   [provider]  HYDROcodone-acetaminophen (NORCO/VICODIN) 5-325 MG tablet Take 1 tablet by mouth every 4 (four) hours as needed. 11/20/19   Evalee Jefferson, PA-C  lisinopril (PRINIVIL,ZESTRIL) 5 MG tablet Take 1 tablet (5 mg total) by mouth daily. Patient not taking: Reported on 11/20/2019 09/28/18   Kathyrn Drown, MD  omeprazole (PRILOSEC) 40 MG capsule Take 1 capsule (40 mg total) by mouth daily. Patient  not taking: Reported on 11/20/2019 08/12/19   Kathyrn Drown, MD  ondansetron (ZOFRAN ODT) 4 MG disintegrating tablet Take 1 tablet (4 mg total) by mouth every 8 (eight) hours as needed for nausea or vomiting. 11/20/19   Tenna Lacko, Almyra Free, PA-C  ondansetron (ZOFRAN) 8 MG tablet Take 1 tablet (8 mg total) by mouth every 8 (eight) hours as needed for nausea. Patient not taking: Reported on 07/08/2018 05/12/18   Mikey Kirschner, MD  sucralfate (CARAFATE) 1 g tablet Dissolve one tablet in one ounce of  water before meals Patient not taking: Reported on 11/20/2019 09/28/18   Kathyrn Drown, MD    Allergies    Codeine  Review of Systems   Review of Systems  Constitutional: Negative for chills and fever.  HENT: Negative.   Eyes: Negative.   Respiratory: Negative for chest tightness and shortness of breath.   Cardiovascular: Negative for chest pain.  Gastrointestinal: Positive for abdominal pain, diarrhea, nausea and vomiting.  Genitourinary: Negative.   Musculoskeletal: Negative for arthralgias, joint swelling and neck pain.  Skin: Negative.  Negative for rash and wound.  Neurological: Negative for dizziness, weakness, light-headedness, numbness and headaches.  Psychiatric/Behavioral: Negative.     Physical Exam Updated Vital Signs BP (!) 147/104   Pulse 92   Temp 98.3 F (36.8 C) (Oral)   Resp 16  Ht 6' (1.829 m)   Wt 129.3 kg   SpO2 96%   BMI 38.65 kg/m   Physical Exam Vitals and nursing note reviewed.  Constitutional:      Appearance: He is well-developed. He is obese.  HENT:     Head: Normocephalic and atraumatic.  Eyes:     Conjunctiva/sclera: Conjunctivae normal.  Cardiovascular:     Rate and Rhythm: Normal rate and regular rhythm.     Heart sounds: Normal heart sounds.  Pulmonary:     Effort: Pulmonary effort is normal.     Breath sounds: Normal breath sounds. No wheezing.  Abdominal:     General: Bowel sounds are normal.     Palpations: Abdomen is soft.      Tenderness: There is abdominal tenderness in the right upper quadrant. Positive signs include Murphy's sign.  Musculoskeletal:        General: Normal range of motion.     Cervical back: Normal range of motion.  Skin:    General: Skin is warm and dry.  Neurological:     Mental Status: He is alert.     ED Results / Procedures / Treatments   Labs (all labs ordered are listed, but only abnormal results are displayed) Labs Reviewed  COMPREHENSIVE METABOLIC PANEL - Abnormal; Notable for the following components:      Result Value   Glucose, Bld 125 (*)    AST 42 (*)    ALT 109 (*)    All other components within normal limits  CBC WITH DIFFERENTIAL/PLATELET  LIPASE, BLOOD  URINALYSIS, ROUTINE W REFLEX MICROSCOPIC    EKG None  Radiology US Abdomen Limited RUQ  Result Date: 11/20/2019 CLINICAL DATA:  24 year old male with a history right upper quadrant pain EXAM: ULTRASOUND ABDOMEN LIMITED RIGHT UPPER QUADRANT COMPARISON:  None. FINDINGS: Gallbladder: No gallbladder wall thickening. Negative sonographic Murphy's sign. No pericholecystic fluid. Hyperechoic focus on the dependent gallbladder wall measures 4 mm suggestive of a polyp. Common bile duct: Diameter: 4 mm-5 mm Liver: Increased echogenicity/echotexture of liver parenchyma. Portal vein is patent on color Doppler imaging with normal direction of blood flow towards the liver. Other: None. IMPRESSION: Negative for cholelithiasis. Gallbladder polyp measures 4 mm. Follow-up ultrasound in 12 months is reasonable. Liver steatosis. Electronically Signed   By: Corrie Mckusick D.O.   On: 11/20/2019 11:11    Procedures Procedures (including critical care time)  Medications Ordered in ED Medications  morphine 4 MG/ML injection 4 mg (4 mg Intravenous Given 11/20/19 0928)  ondansetron (ZOFRAN) injection 4 mg (4 mg Intravenous Given 11/20/19 [id number])    ED Course  I have reviewed the triage vital signs and the nursing notes.  Pertinent labs &  imaging results that were available during my care of the patient were reviewed by me and considered in my medical decision making (see chart for details).    MDM Rules/Calculators/A&P                        Labs and imaging reviewed, interpreted and compared with prior lab and imaging findings.  He has persistently elevated LFT's with known fatty liver disease.  He was screening Hepatitis panel by pcp in 2019 with prior similar episode so not repeated today.  He denies significant etoh use, occasional socially only, no tylenol use.  Discussed probable need for Hida scan to better define functioning of gallbladder.  Referral to GI for further eval/management of this acute finding and for further eval  of his liver steatosis.  He was pain free at time of dc.  Discussed low fat diet.  Hydrocodone and zofran prescribed for prn use if sx return.  Return precautions discussed.   The patient appears reasonably screened and/or stabilized for discharge and I doubt any other medical condition or other [facility] requiring further screening, evaluation, or treatment in the ED at this time prior to discharge.     Final Clinical Impression(s) / ED Diagnoses Final diagnoses:  Abdominal pain  RUQ abdominal pain  Elevated liver transaminase level    Rx / DC Orders ED Discharge Orders         Ordered    HYDROcodone-acetaminophen (NORCO/VICODIN) 5-325 MG tablet  Every 4 hours PRN     11/20/19 1145    ondansetron (ZOFRAN ODT) 4 MG disintegrating tablet  Every 8 hours PRN     11/20/19 1145           Evalee Jefferson, PA-C 11/20/19 1308    Isla Pence, MD 11/20/19 1337

## 2019-11-20 NOTE — Discharge Instructions (Addendum)
Your Ultrasound is normal today, but as discussed you may need an additional test called a HIDA scan to ensure your gallbladder is functioning properly.  Also, you may benefit from further assessment of your known fatty liver.  I recommend avoid as much fat in your diet as consumption of such food can trigger gallbladder attacks.  You liver enzymes are elevated today as they have been in the past (probably from your known fatty liver).  You were screened for infectious hepatitis in 2019 when you hast had this problem (tests were negative) so this was not repeated today.  You may take the hydrocodone prescribed for pain relief if needed.  This will make you drowsy - do not drive within 4 hours of taking this medication. Zofran will help you with nausea if this symptom returns.

## 2019-11-20 NOTE — ED Notes (Signed)
No signature pad in room, unable to have pt sign d/c instructions.  Pt verbalized understanding of d/c instructions.

## 2019-11-20 NOTE — ED Notes (Signed)
Pt verbalized understanding of no driving and to use caution within 4 hours of taking pain meds due to meds cause drowsiness 

## 2019-11-29 ENCOUNTER — Encounter (INDEPENDENT_AMBULATORY_CARE_PROVIDER_SITE_OTHER): Payer: Self-pay | Admitting: Gastroenterology

## 2019-11-29 ENCOUNTER — Other Ambulatory Visit: Payer: Self-pay

## 2019-11-29 ENCOUNTER — Ambulatory Visit (INDEPENDENT_AMBULATORY_CARE_PROVIDER_SITE_OTHER): Payer: Managed Care, Other (non HMO) | Admitting: Gastroenterology

## 2019-11-29 VITALS — BP 137/97 | HR 95 | Temp 97.3°F | Ht 72.0 in | Wt 296.6 lb

## 2019-11-29 DIAGNOSIS — R1011 Right upper quadrant pain: Secondary | ICD-10-CM

## 2019-11-29 DIAGNOSIS — R7989 Other specified abnormal findings of blood chemistry: Secondary | ICD-10-CM

## 2019-11-29 NOTE — Progress Notes (Signed)
Patient profile: Ryan Carey is a 24 y.o. male seen for f/up of ER visit 11/20/19.  History of Present Illness: Ryan Carey is seen today for for ER follow-up.  He reports about 2 weeks ago he developed a generalized fatigue/feeling poorly with chronic right upper quadrant pressure that was exacerbated.  He then after about 24 hours developed severe nausea and diarrhea and was seen virtually and given symptomatic treatment with Zofran,etc.  The nausea and diarrhea improved after 2 to 3 days but continued to have some exacerbation of chronic right upper quadrant pain.  Ultimately right upper quadrant pain significantly worsened and was seen in the ER few days later with work-up as below.  Right upper quadrant pain is described as a pressure-like sensation where it feels like a ball is sitting in his right upper quadrant, and drastically worsens after fatty foods or greasy foods, can also worsen after generalized oral intake with blander foods but greasy makes work  He has some mild nausea with the but no chronic vomiting.  He is on a PPI daily which usually controls reflux but does occasionally take twice a day if symptomatic. Denies any dysphagia.  Has chronic loose stools after greasy foods but otherwise usually 1-2 bowel once a day if eating nongreasy food.  He denies any blood in stool or melena.  No significant lower abdominal pain.  He denies NSAID use.  Alcohol a few times a year.  Previously used oral tobacco but none recently. Works night shift as maintenance.   Has gained some weight recently-may be exercising less  Wt Readings from Last 3 Encounters:  11/29/19 296 lb 9.6 oz (134.5 kg)  11/20/19 285 lb (129.3 kg)  04/06/19 284 lb (128.8 kg)     Last Colonoscopy: none prior  Last Endoscopy: none prior    Past Medical History:  Past Medical History:  Diagnosis Date  . Chronic headaches   . Enuresis     Problem List: Patient Active Problem List   Diagnosis Date  Noted  . Elevated transaminase level 12/24/2017  . BMI 35.0-35.9,adult 12/24/2017  . Fatty liver 12/24/2017  . 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 No pertinent surgical history.  Allergies: Allergies  Allergen Reactions  . Codeine Other (See Comments)     HYDRO- Codone caused the  chest pain      Home Medications:  Current Outpatient Medications:  .  esomeprazole (NEXIUM) 40 MG capsule, Take 40 mg by mouth daily., Disp: , Rfl:  .  lisinopril (PRINIVIL,ZESTRIL) 5 MG tablet, Take 1 tablet (5 mg total) by mouth daily., Disp: 30 tablet, Rfl: 6 .  ondansetron (ZOFRAN ODT) 4 MG disintegrating tablet, Take 1 tablet (4 mg total) by mouth every 8 (eight) hours as needed for nausea or vomiting., Disp: 20 tablet, Rfl: 0 .  ondansetron (ZOFRAN) 8 MG tablet, Take 1 tablet (8 mg total) by mouth every 8 (eight) hours as needed for nausea., Disp: 12 tablet, Rfl: 2 .  HYDROcodone-acetaminophen (NORCO/VICODIN) 5-325 MG tablet, Take 1 tablet by mouth every 4 (four) hours as needed. (Patient not taking: Reported on 11/29/2019), Disp: 20 tablet, Rfl: 0 .  omeprazole (PRILOSEC) 40 MG capsule, Take 1 capsule (40 mg total) by mouth daily. (Patient not taking: Reported on 11/20/2019), Disp: 90 capsule, Rfl: 0 .  sucralfate (CARAFATE) 1 g tablet, Dissolve one tablet in one ounce of  water before meals (Patient not taking: Reported on 11/20/2019), Disp: 45 tablet, Rfl:  0   Family History: family history includes Gallbladder disease in his mother; Gallstones in his maternal grandmother.    Social History:   reports that he has never smoked. He quit smokeless tobacco use about a year ago.  His smokeless tobacco use included snuff. He reports current alcohol use. He reports that he does not use drugs.   Review of Systems: Constitutional: Denies weight loss/weight gain  Eyes: No changes in vision. ENT: No oral lesions, sore throat.  GI: see HPI.  Heme/Lymph: No easy bruising.  CV:  No chest pain.  GU: No hematuria.  Integumentary: No rashes.  Neuro: No headaches.  Psych: No depression/anxiety.  Endocrine: No heat/cold intolerance.  Allergic/Immunologic: No urticaria.  Resp: No cough, SOB.  Musculoskeletal: No joint swelling.    Physical Examination: BP (!) 137/97 (BP Location: Right Arm, Patient Position: Sitting, Cuff Size: Large)   Pulse 95   Temp (!) 97.3 F (36.3 C) (Temporal)   Ht 6' (1.829 m)   Wt 296 lb 9.6 oz (134.5 kg)   BMI 40.23 kg/m  Gen: NAD, alert and oriented x 4 HEENT: PEERLA, EOMI, Neck: supple, no JVD Chest: CTA bilaterally, no wheezes, crackles, or other adventitious sounds CV: RRR, no m/g/c/r Abd: soft, NT, ND, +BS in all four quadrants; no HSM, guarding, ridigity, or rebound tenderness Ext: no edema, well perfused with 2+ pulses, Skin: no rash or lesions noted on observed skin Lymph: no noted LAD  Data:  RUQ [place] 11/20/19--IMPRESSION: Negative for cholelithiasis. Gallbladder polyp measures 4 mm. Follow-up ultrasound in 12 months is reasonable. Liver steatosis.   11/18/19--CBC normal, AST 42, ALT 109, alk phos and bili normal.  Lipase normal.  Urinalysis negative    2019--LFTs elevated with ALT 82, AST 42.  Work-up including ceruloplasmin, ferritin, ANA, hepatitis C antibody, hepatitis B surface antigen iron studies and thyroid were unremarkable.   2018 LFTS (ER admission-diarrhea, abd pain) -T bili 1.3, ALT 132, AST 55    Assessment/Plan: Mr. Ryan Carey is a 24 y.o. male  Ryan Carey was seen today for new patient (initial visit).  Diagnoses and all orders for this visit:  RUQ pain -     NM Hepato W/EjeCT Fract; Future  Elevated LFTs -     NM Hepato W/EjeCT Fract; Future    1.  Right upper quadrant pain-has been evaluated recently with ultrasound negative for stones.  Does report his mother and maternal grandmother both had CCY for nonfunctioning gallbladder without stones.  Symptoms do sound classic for gallbladder  disease-Will order HIDA scan for further evaluation.Marland Kitchen  He is on a PPI without specific risk factors for peptic ulcer disease or gastritis.   2.  Elevated LFTs-elevated dating back at least 3 years, serologic work-up 2019 was unremarkable.  May have overlap of some fatty liver disease.  He has gained weight recently.  We specifically discussed some diet modifications such as decreasing his soda and fast food intake.  He will try and get more exercise.  Would recommend repeating LFTS in 3 to 6 months. Does not drink alcohol.   3.  GERD-well-controlled on PPI once a day, occasional twice a day use.  Symptoms may be exacerbated with recent weight gain.  Diet modifications reviewed no upper GI alarm symptoms  4.  Loose stools-mainly problematic after greasy foods.  Work-up as above.  Avoid dietary triggers  Further recommendations pending HIDA scan results  I personally performed the service, non-incident to. (WP)  Laurine Blazer, [facility]

## 2019-11-29 NOTE — Patient Instructions (Addendum)
Try to decrease intake of fatty greasy food and sodas as we discussed.  We are ordering a HIDA scan to evaluate your gallbladder function.  We will call with results

## 2019-12-10 ENCOUNTER — Other Ambulatory Visit: Payer: Self-pay

## 2019-12-10 ENCOUNTER — Ambulatory Visit (HOSPITAL_COMMUNITY)
Admission: RE | Admit: 2019-12-10 | Discharge: 2019-12-10 | Disposition: A | Discharge status: Home / Self Care | Payer: Managed Care, Other (non HMO) | Source: Ambulatory Visit | Attending: Gastroenterology | Admitting: Gastroenterology

## 2019-12-10 DIAGNOSIS — R7989 Other specified abnormal findings of blood chemistry: Secondary | ICD-10-CM | POA: Insufficient documentation

## 2019-12-10 DIAGNOSIS — R1011 Right upper quadrant pain: Secondary | ICD-10-CM | POA: Diagnosis not present

## 2019-12-10 MED ORDER — TECHNETIUM TC 99M MEBROFENIN IV KIT
5.0000 | PACK | Freq: Once | INTRAVENOUS | Status: AC | PRN
  Administered 2019-12-10: 5 via INTRAVENOUS

## 2019-12-15 ENCOUNTER — Telehealth (INDEPENDENT_AMBULATORY_CARE_PROVIDER_SITE_OTHER): Payer: Self-pay | Admitting: Gastroenterology

## 2019-12-15 NOTE — Telephone Encounter (Signed)
Patient called stated he needs a work note for 2/12 when he had the scan done - it needs to state he can return to work on 2/14 - patient will pick up by the end of this week

## 2019-12-15 NOTE — Telephone Encounter (Signed)
This is fine to give work note for his HIDA scan. I have tried to call him twice w/ results with no answer and full VM box.   Can let him know when you walk to him HIDA was normal. Thanks.

## 2019-12-17 ENCOUNTER — Encounter (INDEPENDENT_AMBULATORY_CARE_PROVIDER_SITE_OTHER): Payer: Self-pay | Admitting: Gastroenterology

## 2019-12-27 ENCOUNTER — Other Ambulatory Visit: Payer: Self-pay

## 2019-12-27 ENCOUNTER — Ambulatory Visit: Payer: Managed Care, Other (non HMO) | Admitting: Family Medicine

## 2019-12-27 VITALS — BP 128/90 | Temp 97.9°F | Ht 72.0 in | Wt 297.0 lb

## 2019-12-27 DIAGNOSIS — G4730 Sleep apnea, unspecified: Secondary | ICD-10-CM

## 2019-12-27 DIAGNOSIS — R5383 Other fatigue: Secondary | ICD-10-CM

## 2019-12-27 DIAGNOSIS — R7401 Elevation of levels of liver transaminase levels: Secondary | ICD-10-CM

## 2019-12-27 DIAGNOSIS — F321 Major depressive disorder, single episode, moderate: Secondary | ICD-10-CM

## 2019-12-27 DIAGNOSIS — K760 Fatty (change of) liver, not elsewhere classified: Secondary | ICD-10-CM

## 2019-12-27 DIAGNOSIS — R635 Abnormal weight gain: Secondary | ICD-10-CM

## 2019-12-27 MED ORDER — SERTRALINE HCL 50 MG PO TABS: 50.0000 mg | ORAL_TABLET | Freq: Every day | ORAL | 1 refills | Status: DC

## 2019-12-27 NOTE — Progress Notes (Signed)
Subjective:    Patient ID: Ryan Carey, male    DOB: Aug 24, 1996, 24 y.o.   MRN: [id number]  HPIpt states his finance has told him he snores very loud and stops breathing in his sleep. Would like to discuss getting a sleep study.  Patient relates a lot of daytime sleepiness even relates one time getting sleepy with driving.  Denies any major setbacks currently.  States he has been trying to do the best he can with eating healthy and trying to lose weight but he knows that he is overweight Patient does relate significant fatigue tiredness.  Patient also relates depression and anxiety symptoms been going on for months denies being suicidal. Depression screen [facility] 2/9 12/27/2019 12/24/2017  Decreased Interest 1 1  Down, Depressed, Hopeless 1 0  PHQ - 2 Score 2 1  Altered sleeping 3 3  Tired, decreased energy 3 3  Change in appetite 3 0  Feeling bad or failure about yourself  2 0  Trouble concentrating 3 0  Moving slowly or fidgety/restless 3 0  Suicidal thoughts 1 0  PHQ-9 Score 20 7  Difficult doing work/chores Extremely dIfficult -   GAD 7 : Generalized Anxiety Score 12/27/2019  Nervous, Anxious, on Edge 2  Control/stop worrying 1  Worry too much - different things 3  Trouble relaxing 3  Restless 2  Afraid - awful might happen 2  Anxiety Difficulty Extremely difficult       Review of Systems  Constitutional: Positive for fatigue. Negative for activity change, fever and unexpected weight change.  HENT: Negative for congestion and rhinorrhea.   Respiratory: Negative for cough and shortness of breath.   Cardiovascular: Negative for chest pain.  Gastrointestinal: Negative for abdominal pain, diarrhea, nausea and vomiting.  Genitourinary: Negative for dysuria and hematuria.  Neurological: Negative for weakness and headaches.  Psychiatric/Behavioral: Negative for behavioral problems and confusion.       Objective:   Physical Exam Constitutional:      Appearance: He is  well-developed.  HENT:     Head: Normocephalic and atraumatic.     Right Ear: External ear normal.     Left Ear: External ear normal.     Nose: Nose normal.  Eyes:     Pupils: Pupils are equal, round, and reactive to light.  Neck:     Thyroid: No thyromegaly.  Cardiovascular:     Rate and Rhythm: Normal rate and regular rhythm.     Heart sounds: Normal heart sounds. No murmur.  Pulmonary:     Effort: Pulmonary effort is normal. No respiratory distress.     Breath sounds: Normal breath sounds. No wheezing.  Abdominal:     General: Bowel sounds are normal. There is no distension.     Palpations: Abdomen is soft. There is no mass.     Tenderness: There is no abdominal tenderness.  Genitourinary:    Penis: Normal.   Musculoskeletal:        General: Normal range of motion.     Cervical back: Normal range of motion and neck supple.  Lymphadenopathy:     Cervical: No cervical adenopathy.  Skin:    General: Skin is warm and dry.     Findings: No erythema.  Neurological:     Mental Status: He is alert.     Motor: No abnormal muscle tone.  Psychiatric:        Behavior: Behavior normal.        Judgment: Judgment normal.  Assessment & Plan:  1. Sleep apnea, unspecified type Patient needs sleep study.  We will go ahead and order this hopefully this will be done in the near future in the meantime patient was cautioned not to do any driving when he feels tired or sleepy to avoid the possibility of a rack - CBC with Differential/Platelet - TSH - Hemoglobin 123456 - Basic metabolic panel - Hepatic function panel - Split night study  2. Weight gain Patient was counseled to watch his diet try to stay more physically active cut back on portions - CBC with Differential/Platelet - TSH - Hemoglobin 123456 - Basic metabolic panel - Hepatic function panel - Split night study  3. Elevated transaminase level Patient with a history of elevated liver enzyme we will recheck this  to see what the profile is doing - CBC with Differential/Platelet - TSH - Hemoglobin 123456 - Basic metabolic panel - Hepatic function panel - Split night study  4. Fatty liver He has a history of fatty liver and is been encouraged to watch his diet and try to lose weight to lessen his risk of getting cirrhosis patient only drinks occasionally - CBC with Differential/Platelet - TSH - Hemoglobin 123456 - Basic metabolic panel - Hepatic function panel - Split night study  5. Other fatigue Significant fatigue go forward with lab testing await results - CBC with Differential/Platelet - TSH - Hemoglobin 123456 - Basic metabolic panel - Hepatic function panel - Split night study Patient not actively suicidal Major depression with anxiety referral for counseling as well as referral for depression evaluation I did discuss benefits and risk of medications.  Patient understands that medication can help but it may take several weeks to help we will start Zoloft 50 mg one half daily for the first week then 1 daily thereafter patient is to follow-up in 3 to 4 weeks patient understands if he starts feeling suicidal or feeling severely down to stop the medicine and notify us immediately

## 2019-12-28 LAB — BASIC METABOLIC PANEL
BUN/Creatinine Ratio: 21 — ABNORMAL HIGH (ref 9–20)
BUN: 15 mg/dL (ref 6–20)
CO2: 26 mmol/L (ref 20–29)
Calcium: 9.2 mg/dL (ref 8.7–10.2)
Chloride: 102 mmol/L (ref 96–106)
Creatinine, Ser: 0.72 mg/dL — ABNORMAL LOW (ref 0.76–1.27)
GFR calc Af Amer: 150 mL/min/{1.73_m2} (ref >59)
GFR calc non Af Amer: 130 mL/min/{1.73_m2} (ref >59)
Glucose: 106 mg/dL — ABNORMAL HIGH (ref 65–99)
Potassium: 4.4 mmol/L (ref 3.5–5.2)
Sodium: 140 mmol/L (ref 134–144)

## 2019-12-28 LAB — CBC WITH DIFFERENTIAL/PLATELET
Basophils Absolute: 0.1 10*3/uL (ref 0.0–0.2)
Basos: 1 %
EOS (ABSOLUTE): 0.3 10*3/uL (ref 0.0–0.4)
Eos: 3 %
Hematocrit: 41.2 % (ref 37.5–51.0)
Hemoglobin: 14.2 g/dL (ref 13.0–17.7)
Immature Grans (Abs): 0 10*3/uL (ref 0.0–0.1)
Immature Granulocytes: 0 %
Lymphocytes Absolute: 3.4 10*3/uL — ABNORMAL HIGH (ref 0.7–3.1)
Lymphs: 36 %
MCH: 27.3 pg (ref 26.6–33.0)
MCHC: 34.5 g/dL (ref 31.5–35.7)
MCV: 79 fL (ref 79–97)
Monocytes Absolute: 0.7 10*3/uL (ref 0.1–0.9)
Monocytes: 8 %
Neutrophils Absolute: 5.1 10*3/uL (ref 1.4–7.0)
Neutrophils: 52 %
Platelets: 308 10*3/uL (ref 150–450)
RBC: 5.2 x10E6/uL (ref 4.14–5.80)
RDW: 13.7 % (ref 11.6–15.4)
WBC: 9.6 10*3/uL (ref 3.4–10.8)

## 2019-12-28 LAB — HEPATIC FUNCTION PANEL
ALT: 106 IU/L — ABNORMAL HIGH (ref 0–44)
AST: 41 IU/L — ABNORMAL HIGH (ref 0–40)
Albumin: 4.4 g/dL (ref 4.1–5.2)
Alkaline Phosphatase: 63 IU/L (ref 39–117)
Bilirubin Total: 0.6 mg/dL (ref 0.0–1.2)
Bilirubin, Direct: 0.14 mg/dL (ref 0.00–0.40)
Total Protein: 7.5 g/dL (ref 6.0–8.5)

## 2019-12-28 LAB — HEMOGLOBIN A1C
Est. average glucose Bld gHb Est-mCnc: 123 mg/dL
Hgb A1c MFr Bld: 5.9 % — ABNORMAL HIGH (ref 4.8–5.6)

## 2019-12-28 LAB — TSH: TSH: 1.98 u[IU]/mL (ref 0.450–4.500)

## 2020-01-11 ENCOUNTER — Other Ambulatory Visit: Payer: Self-pay | Admitting: *Deleted

## 2020-01-11 ENCOUNTER — Telehealth: Payer: Self-pay | Admitting: *Deleted

## 2020-01-11 DIAGNOSIS — G4730 Sleep apnea, unspecified: Secondary | ICD-10-CM

## 2020-01-11 NOTE — Telephone Encounter (Signed)
Order put in for a home sleep study at [facility] long. Left message to return call to notify pt.

## 2020-01-11 NOTE — Telephone Encounter (Signed)
Fax from [employer]. Coverage for polysomnography, sleep monitoring of patient in a sleep lab with breathing equipment has been denied. A home sleep stuudy can be done and does not need an approval. Do you want to order a home sleep study. See letter for explanation. Letter in dr. Bary Leriche folder.

## 2020-01-11 NOTE — Telephone Encounter (Signed)
I am fine with a home sleep study it will help give [place] information if patient is dealing with sleep apnea Please make sure we explained to the patient this whole process

## 2020-01-12 NOTE — Telephone Encounter (Signed)
Pt.notified

## 2020-01-21 ENCOUNTER — Telehealth: Payer: Self-pay | Admitting: Family Medicine

## 2020-01-21 MED ORDER — LISINOPRIL 5 MG PO TABS: 5.0000 mg | ORAL_TABLET | Freq: Every day | ORAL | 5 refills | Status: DC

## 2020-01-21 MED ORDER — ESOMEPRAZOLE MAGNESIUM 40 MG PO CPDR: 40.0000 mg | DELAYED_RELEASE_CAPSULE | Freq: Every day | ORAL | 5 refills | Status: DC

## 2020-01-21 NOTE — Telephone Encounter (Signed)
Please document that the patient takes this on a regular basis? May go ahead with refill Nexium 40 mg 1 daily #30 with 5 refills

## 2020-01-21 NOTE — Telephone Encounter (Signed)
Pt needs RF on esomeprazole & lisinopril  States he recently legally changed his name & has no refills left on these medicines  Please send to [place] call pt when done

## 2020-01-21 NOTE — Telephone Encounter (Signed)
Refills sent electronically to [facility] -- pt notified.

## 2020-01-24 ENCOUNTER — Encounter: Payer: Managed Care, Other (non HMO) | Admitting: Family Medicine

## 2020-01-24 NOTE — Progress Notes (Signed)
   Subjective:    Patient ID: Ryan Carey, male    DOB: [birth year], 24 y.o.   MRN: [id number]  HPI  Virtual Visit via Video Note  I connected with Ryan Carey on 01/24/20 at  9:00 AM EDT by a video enabled telemedicine application and verified that I am speaking with the correct person using two identifiers.  Location: Patient: home Provider: office    I discussed the limitations of evaluation and management by telemedicine and the availability of in person appointments. The patient expressed understanding and agreed to proceed.  History of Present Illness:    Observations/Objective:   Assessment and Plan:   Follow Up Instructions:    I discussed the assessment and treatment plan with the patient. The patient was provided an opportunity to ask questions and all were answered. The patient agreed with the plan and demonstrated an understanding of the instructions.   The patient was advised to call back or seek an in-person evaluation if the symptoms worsen or if the condition fails to improve as anticipated.  I provided 0 minutes of non-face-to-face time during this encounter.     Review of Systems     Objective:   Physical Exam        Assessment & Plan:   This encounter was created in error - please disregard.

## 2020-01-26 ENCOUNTER — Encounter: Payer: Self-pay | Admitting: Family Medicine

## 2020-02-04 ENCOUNTER — Other Ambulatory Visit: Payer: Self-pay

## 2020-02-04 ENCOUNTER — Ambulatory Visit (INDEPENDENT_AMBULATORY_CARE_PROVIDER_SITE_OTHER): Payer: Managed Care, Other (non HMO) | Admitting: Family Medicine

## 2020-02-04 DIAGNOSIS — F321 Major depressive disorder, single episode, moderate: Secondary | ICD-10-CM

## 2020-02-04 DIAGNOSIS — R7401 Elevation of levels of liver transaminase levels: Secondary | ICD-10-CM | POA: Diagnosis not present

## 2020-02-04 DIAGNOSIS — G4730 Sleep apnea, unspecified: Secondary | ICD-10-CM | POA: Diagnosis not present

## 2020-02-04 MED ORDER — SERTRALINE HCL 50 MG PO TABS: 50.0000 mg | ORAL_TABLET | Freq: Every day | ORAL | 3 refills | Status: DC

## 2020-02-04 NOTE — Progress Notes (Signed)
   Subjective:  Audiovisual  Patient ID: Ryan Carey, male    DOB: 04-Sep-1996, 24 y.o.   MRN: [id number]  HPI Pt is needing medication follow up. Pt is taking Nexium 40 mg , Lisinopril 5 mg and Zoloft 50 mg daily. Pt also taking Hydrocodone 5-325 mg as needed for pain. Pt states he is doing well; no issues or side effects from meds. Patient relates that he is taking his medications feels that his blood pressure doing good feels his depression is improving denies being suicidal Virtual Visit via Telephone Note  I connected with Ryan Carey on 02/04/20 at  9:00 AM EDT by telephone and verified that I am speaking with the correct person using two identifiers.  Location: Patient: home Provider: office   I discussed the limitations, risks, security and privacy concerns of performing an evaluation and management service by telephone and the availability of in person appointments. I also discussed with the patient that there may be a patient responsible charge related to this service. The patient expressed understanding and agreed to proceed.   History of Present Illness:    Observations/Objective:   Assessment and Plan:   Follow Up Instructions:    I discussed the assessment and treatment plan with the patient. The patient was provided an opportunity to ask questions and all were answered. The patient agreed with the plan and demonstrated an understanding of the instructions.   The patient was advised to call back or seek an in-person evaluation if the symptoms worsen or if the condition fails to improve as anticipated.  I provided 15 minutes of non-face-to-face time during this encounter.       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  Follow-up again in 4 months patient states his moods are doing well not  suicidal  Reflux under good control continue the Nexium on a regular basis  Blood pressure to the best of his knowledge under good control continue current medication  Patient was exposed to thrush from his child but he is having no symptoms the chance of him getting thrush from his infant is very small if the patient does develop symptoms of thrush he will call us and we will send in medication  Prediabetes we did discuss minimizing starches exercise portion control losing weight  Elevated liver enzymes patient will do a follow-up visit in 4 months lab work at that time  Patient has sleep study coming up we will follow up the results of this

## 2020-02-10 ENCOUNTER — Other Ambulatory Visit: Payer: Self-pay

## 2020-02-10 ENCOUNTER — Ambulatory Visit: Payer: 59 | Attending: Family Medicine | Admitting: Neurology

## 2020-02-10 DIAGNOSIS — G4730 Sleep apnea, unspecified: Secondary | ICD-10-CM

## 2020-02-10 DIAGNOSIS — G4733 Obstructive sleep apnea (adult) (pediatric): Secondary | ICD-10-CM | POA: Diagnosis not present

## 2020-02-10 DIAGNOSIS — Z79899 Other long term (current) drug therapy: Secondary | ICD-10-CM | POA: Insufficient documentation

## 2020-02-13 NOTE — Procedures (Signed)
  [place] A. Merlene Laughter, MD     [email or web address]             HOME SLEEP TEST  LOCATION: [facility]   Patient Name: Ryan Carey, Ryan Carey Date: 02/11/2020 Gender: Male D.O.B: [birth year] Age (years): 23 Referring Provider: Sallee Lange Height (inches): 72 Interpreting Physician: Phillips Odor MD, ABSM Weight (lbs): 297 RPSGT: Peak, Robert BMI: 40 MRN: [id number] Neck Size: CLINICAL INFORMATION Sleep Study Type: HST     Indication for sleep study: N/A     Epworth Sleepiness Score: 11  SLEEP STUDY TECHNIQUE A multi-channel overnight portable sleep study was performed. The channels recorded were: nasal airflow, thoracic respiratory movement, and oxygen saturation with a pulse oximetry. Snoring was also monitored.  MEDICATIONS Patient self administered medications include: N/A.  Current Outpatient Medications:  .  esomeprazole (NEXIUM) 40 MG capsule, Take 1 capsule (40 mg total) by mouth daily., Disp: 30 capsule, Rfl: 5 .  HYDROcodone-acetaminophen (NORCO/VICODIN) 5-325 MG tablet, Take 1 tablet by mouth every 4 (four) hours as needed., Disp: 20 tablet, Rfl: 0 .  lisinopril (ZESTRIL) 5 MG tablet, Take 1 tablet (5 mg total) by mouth daily., Disp: 30 tablet, Rfl: 5 .  ondansetron (ZOFRAN ODT) 4 MG disintegrating tablet, Take 1 tablet (4 mg total) by mouth every 8 (eight) hours as needed for nausea or vomiting., Disp: 20 tablet, Rfl: 0 .  sertraline (ZOLOFT) 50 MG tablet, Take 1 tablet (50 mg total) by mouth daily., Disp: 30 tablet, Rfl: 3   SLEEP ARCHITECTURE Patient was studied for 414.5 minutes. The sleep efficiency was 86.4 % and the patient was supine for 50.6%. The arousal index was 0.0 per hour.  RESPIRATORY PARAMETERS The overall AHI was 24.6 per hour, with a central apnea index of 0.0 per hour.  The oxygen nadir was 67% during sleep.     CARDIAC DATA Mean heart rate during sleep was 72.4 bpm.  IMPRESSIONS Moderate obstructive sleep  apnea occurred during this study (AHI = 24.6/h).  AUTOPAP 8-15 is recommended.     Delano Metz, MD Diplomate, American Board of Sleep Medicine.  ELECTRONICALLY SIGNED ON:  02/13/2020, 11:02 PM [place] PH: [phone]   FX: [phone] [place]

## 2020-02-14 ENCOUNTER — Telehealth: Payer: Self-pay | Admitting: Family Medicine

## 2020-02-14 NOTE — Progress Notes (Signed)
CPAP script written and awaiting provider signature. [employer] is out of network with pt insurance. Left message to return call

## 2020-02-14 NOTE — Telephone Encounter (Signed)
CPAP script written out, awaiting signature. Left message to return call

## 2020-02-14 NOTE — Telephone Encounter (Signed)
Abnormal sleep study Message sent to the nurses via CC chart Will need CPAP thank you

## 2020-02-14 NOTE — Progress Notes (Signed)
Patient has sleep apnea. Please let patient know that his test does show sleep apnea It is recommended for him to do a CPAP with auto titration of 8-15 centimeters of pressure Please refer patient to whoever is on his list/insurance as of supplier whether it be [facility] or [employer] Then also encourage patient to do a follow-up with [place] in 3 to 4 weeks after starting the CPAP-this is standard per insurance guidelines to [employer] that he is utilizing the CPAP machine (otherwise if he does not do this there is a risk insurance company can stop paying on the CPAP)

## 2020-02-14 NOTE — Progress Notes (Signed)
Discussed with pt and forwarded message to brendale

## 2020-02-22 ENCOUNTER — Emergency Department (HOSPITAL_COMMUNITY)
Admission: EM | Admit: 2020-02-22 | Discharge: 2020-02-22 | Disposition: A | Discharge status: Home / Self Care | Payer: 59 | Attending: Emergency Medicine | Admitting: Emergency Medicine

## 2020-02-22 ENCOUNTER — Encounter: Payer: Self-pay | Admitting: Family Medicine

## 2020-02-22 ENCOUNTER — Emergency Department (HOSPITAL_COMMUNITY): Payer: 59

## 2020-02-22 ENCOUNTER — Ambulatory Visit (INDEPENDENT_AMBULATORY_CARE_PROVIDER_SITE_OTHER): Payer: 59 | Admitting: Family Medicine

## 2020-02-22 ENCOUNTER — Encounter (HOSPITAL_COMMUNITY): Payer: Self-pay | Admitting: *Deleted

## 2020-02-22 ENCOUNTER — Other Ambulatory Visit: Payer: Self-pay

## 2020-02-22 VITALS — HR 95 | Temp 100.4°F | Resp 20

## 2020-02-22 DIAGNOSIS — U071 COVID-19: Secondary | ICD-10-CM | POA: Diagnosis not present

## 2020-02-22 DIAGNOSIS — Z87891 Personal history of nicotine dependence: Secondary | ICD-10-CM | POA: Insufficient documentation

## 2020-02-22 DIAGNOSIS — R509 Fever, unspecified: Secondary | ICD-10-CM | POA: Diagnosis not present

## 2020-02-22 DIAGNOSIS — Z20822 Contact with and (suspected) exposure to covid-19: Secondary | ICD-10-CM

## 2020-02-22 DIAGNOSIS — R059 Cough, unspecified: Secondary | ICD-10-CM

## 2020-02-22 DIAGNOSIS — R05 Cough: Secondary | ICD-10-CM | POA: Diagnosis not present

## 2020-02-22 DIAGNOSIS — R0602 Shortness of breath: Secondary | ICD-10-CM | POA: Diagnosis present

## 2020-02-22 LAB — CBC WITH DIFFERENTIAL/PLATELET
Abs Immature Granulocytes: 0.01 10*3/uL (ref 0.00–0.07)
Basophils Absolute: 0 10*3/uL (ref 0.0–0.1)
Basophils Relative: 0 %
Eosinophils Absolute: 0 10*3/uL (ref 0.0–0.5)
Eosinophils Relative: 1 %
HCT: 41.8 % (ref 39.0–52.0)
Hemoglobin: 13.6 g/dL (ref 13.0–17.0)
Immature Granulocytes: 0 %
Lymphocytes Relative: 27 %
Lymphs Abs: 1.6 10*3/uL (ref 0.7–4.0)
MCH: 26.9 pg (ref 26.0–34.0)
MCHC: 32.5 g/dL (ref 30.0–36.0)
MCV: 82.6 fL (ref 80.0–100.0)
Monocytes Absolute: 0.4 10*3/uL (ref 0.1–1.0)
Monocytes Relative: 7 %
Neutro Abs: 3.9 10*3/uL (ref 1.7–7.7)
Neutrophils Relative %: 65 %
Platelets: 187 10*3/uL (ref 150–400)
RBC: 5.06 MIL/uL (ref 4.22–5.81)
RDW: 13.5 % (ref 11.5–15.5)
WBC: 6 10*3/uL (ref 4.0–10.5)
nRBC: 0 % (ref 0.0–0.2)

## 2020-02-22 LAB — BASIC METABOLIC PANEL
Anion gap: 11 (ref 5–15)
BUN: 14 mg/dL (ref 6–20)
CO2: 23 mmol/L (ref 22–32)
Calcium: 8.5 mg/dL — ABNORMAL LOW (ref 8.9–10.3)
Chloride: 102 mmol/L (ref 98–111)
Creatinine, Ser: 0.8 mg/dL (ref 0.61–1.24)
GFR calc Af Amer: >60 mL/min (ref >60)
GFR calc non Af Amer: >60 mL/min (ref >60)
Glucose, Bld: 101 mg/dL — ABNORMAL HIGH (ref 70–99)
Potassium: 3.8 mmol/L (ref 3.5–5.1)
Sodium: 136 mmol/L (ref 135–145)

## 2020-02-22 LAB — D-DIMER, QUANTITATIVE: D-Dimer, Quant: 0.56 ug/mL-FEU — ABNORMAL HIGH (ref 0.00–0.50)

## 2020-02-22 LAB — RESPIRATORY PANEL BY RT PCR (FLU A&B, COVID)
Influenza A by PCR: NEGATIVE
Influenza B by PCR: NEGATIVE
SARS Coronavirus 2 by RT PCR: POSITIVE — AB

## 2020-02-22 MED ORDER — BENZONATATE 100 MG PO CAPS
200.0000 mg | ORAL_CAPSULE | Freq: Once | ORAL | Status: AC
  Administered 2020-02-22: 19:00:00 200 mg via ORAL
  Filled 2020-02-22: qty 2

## 2020-02-22 MED ORDER — BENZONATATE 100 MG PO CAPS
100.0000 mg | ORAL_CAPSULE | Freq: Three times a day (TID) | ORAL | 0 refills | Status: DC | PRN
Start: 2020-02-22 — End: 2022-09-06

## 2020-02-22 MED ORDER — SODIUM CHLORIDE 0.9 % IV SOLN
500.0000 mg | Freq: Once | INTRAVENOUS | Status: AC
  Administered 2020-02-22: 20:00:00 500 mg via INTRAVENOUS
  Filled 2020-02-22: qty 500

## 2020-02-22 MED ORDER — DEXAMETHASONE SODIUM PHOSPHATE 10 MG/ML IJ SOLN
10.0000 mg | Freq: Once | INTRAMUSCULAR | Status: AC
  Administered 2020-02-22: 10 mg via INTRAVENOUS
  Filled 2020-02-22: qty 1

## 2020-02-22 MED ORDER — DOXYCYCLINE HYCLATE 100 MG PO CAPS
100.0000 mg | ORAL_CAPSULE | Freq: Two times a day (BID) | ORAL | 0 refills | Status: DC
Start: 2020-02-22 — End: 2022-09-06

## 2020-02-22 MED ORDER — SODIUM CHLORIDE 0.9 % IV SOLN
1.0000 g | Freq: Once | INTRAVENOUS | Status: AC
  Administered 2020-02-22: 19:00:00 1 g via INTRAVENOUS
  Filled 2020-02-22: qty 10

## 2020-02-22 MED ORDER — IOHEXOL 350 MG/ML SOLN
100.0000 mL | Freq: Once | INTRAVENOUS | Status: AC | PRN
  Administered 2020-02-22: 100 mL via INTRAVENOUS

## 2020-02-22 NOTE — ED Triage Notes (Signed)
Pt with sob and cough since Wednesday. Seen by PCP today, sent here for xray and some tests.

## 2020-02-22 NOTE — Progress Notes (Signed)
Patient ID: Ryan Carey, male    DOB: 11-Jul-1996, 24 y.o.   MRN: [id number]   Chief Complaint  Patient presents with  . Cough    lightheaded when coughing or moving around alot   Subjective:    HPI Pt seen outside as a tent visit. Patient with deep cough, congestion. Fever and feeling lightheaded for several days- fever started yesterday.   Pt stating feeling congestion and coughing for past 6 days.  Has worsened with the coughing and sob over the last 2-3 days.  Feeling he has a fever. No loss of taste/smell.  Not having a productive cough, sore throat, n/v/d.    Works at [employer].  Masking while at work.  Not near anyone with known positive covid testing. His wife with coughing over last week, but has improved.  Living with several family members that are elderly and his infant son.  Has been taking mucinex w/o relief.   Medical History Stylez has a past medical history of Chronic headaches and Enuresis.   Outpatient Encounter Medications as of 02/22/2020  Medication Sig  . esomeprazole (NEXIUM) 40 MG capsule Take 1 capsule (40 mg total) by mouth daily.  Marland Kitchen lisinopril (ZESTRIL) 5 MG tablet Take 1 tablet (5 mg total) by mouth daily.  . sertraline (ZOLOFT) 50 MG tablet Take 1 tablet (50 mg total) by mouth daily.  . [DISCONTINUED] HYDROcodone-acetaminophen (NORCO/VICODIN) 5-325 MG tablet Take 1 tablet by mouth every 4 (four) hours as needed.  . [DISCONTINUED] ondansetron (ZOFRAN ODT) 4 MG disintegrating tablet Take 1 tablet (4 mg total) by mouth every 8 (eight) hours as needed for nausea or vomiting.   No facility-administered encounter medications on file as of 02/22/2020.     Review of Systems  Constitutional: Positive for activity change, fatigue and fever. Negative for chills.  HENT: Positive for congestion. Negative for ear pain, postnasal drip, rhinorrhea, sinus pressure, sinus pain, sneezing and sore throat.   Eyes: Negative for pain, discharge and  itching.  Respiratory: Positive for cough and shortness of breath. Negative for chest tightness and wheezing.   Cardiovascular: Negative for chest pain.  Gastrointestinal: Negative for diarrhea, nausea and vomiting.  Skin: Negative for rash.  Neurological: Negative for headaches.     Vitals Pulse 95   Temp (!) 100.4 F (38 C) (Temporal)   Resp 20   SpO2 90%   Objective:   Physical Exam Constitutional:      General: He is in acute distress (mild).     Appearance: Normal appearance. He is ill-appearing. He is not toxic-appearing.  HENT:     Head: Normocephalic.     Nose: Nose normal. No congestion or rhinorrhea.     Mouth/Throat:     Mouth: Mucous membranes are moist.     Pharynx: No oropharyngeal exudate or posterior oropharyngeal erythema.  Eyes:     Extraocular Movements: Extraocular movements intact.     Conjunctiva/sclera: Conjunctivae normal.     Pupils: Pupils are equal, round, and reactive to light.  Cardiovascular:     Rate and Rhythm: Normal rate and regular rhythm.     Pulses: Normal pulses.     Heart sounds: Normal heart sounds. No murmur.  Pulmonary:     Effort: Respiratory distress (mild) present.     Breath sounds: Normal breath sounds. No wheezing, rhonchi or rales.     Comments: Unable to speak or deep inspiration without coughing.  Musculoskeletal:        General: Normal range  of motion.     Right lower leg: No edema.     Left lower leg: No edema.  Skin:    General: Skin is warm and dry.     Findings: No rash.  Neurological:     General: No focal deficit present.     Mental Status: He is alert and oriented to person, place, and time.  Psychiatric:        Mood and Affect: Mood normal.        Behavior: Behavior normal.      Assessment and Plan   1. Suspected COVID-19 virus infection  2. Cough  3. Fever, unspecified fever cause    Concern for covid virus and hypoxia with oxygen sat at 90%.   Unable to get a rapid test done today.   Pt  is hypoxic and having hard time breathing w/o coughing.   Concern for pneumonia vs. Covid.  Advising pt to go to ER for further evaluation.  Pt voiced understanding and will proceed to ER.  F/u after ER visit.  Did see patient after Dr. Lovena Le.  I agree with the management.  Patient going to ER for further evaluation.-Dr. Nicki Reaper

## 2020-02-22 NOTE — ED Notes (Signed)
Pt ambulated in hallway on room air. Pt c/o of sob and dizziness.  Pt very tachypneic. O2 96%.

## 2020-02-22 NOTE — ED Notes (Signed)
Date and time results received: 02/22/20 2018 (use smartphrase ".now" to insert current time)  Test: Covid Critical Value: positive  Name of Provider Notified: Albrizze, Moro  Orders Received? Or Actions Taken?: Actions Taken: no orders received

## 2020-02-22 NOTE — ED Provider Notes (Signed)
[facility] EMERGENCY DEPARTMENT Provider Note   CSN: [id number] Arrival date & time: 02/22/20  1506     History Chief Complaint  Patient presents with  . Shortness of Breath    Ryan Carey is a 24 y.o. male past medical history significant for sleep apnea presents to emergency department today with chief complaint of progressively worsening shortness of breath and nonproductive cough x1 week. Patient states he has tried taking Mucinex without any symptom relief. He feels short of breath at rest and it is worse with exertion. He does admit to chest pain that he describes as soreness during coughing episodes. Denies any chest pain at rest or with exertion. He has had fevers at home, T-max of 100.7 yesterday. No antipyretics taken. He went to PCP today for further evaluation and was advised to go to the emergency department because he had low oxygen levels in the low 90s. He does also endorse chills. He denies headache, neck pain, abdominal pain, nausea, vomiting, urinary symptoms, diarrhea. He denies loss of sense of taste or smell. Denies any sick contacts or contact with anyone known positive for COVID-19. Patient denies any tobacco use.  He did not receive Covid vaccines.  History provided by patient with additional history obtained from chart review.         Past Medical History:  Diagnosis Date  . Chronic headaches   . Enuresis     Patient Active Problem List   Diagnosis Date Noted  . Elevated transaminase level 12/24/2017  . BMI 35.0-35.9,adult 12/24/2017  . Fatty liver 12/24/2017  . 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 No pertinent surgical history.     Family History  Problem Relation Age of Onset  . Gallbladder disease Mother   . Gallstones Maternal Grandmother     Social History   Tobacco Use  . Smoking status: Never Smoker  . Smokeless tobacco: Former Systems developer    Types: Snuff  Substance Use Topics  . Alcohol use: Yes    Comment: occ  . Drug  use: No    Home Medications Prior to Admission medications   Medication Sig Start Date End Date Taking? Authorizing Provider  esomeprazole (NEXIUM) 40 MG capsule Take 1 capsule (40 mg total) by mouth daily. 01/21/20  Yes Luking, Elayne Snare, MD  lisinopril (ZESTRIL) 5 MG tablet Take 1 tablet (5 mg total) by mouth daily. 01/21/20  Yes Kathyrn Drown, MD  sertraline (ZOLOFT) 50 MG tablet Take 1 tablet (50 mg total) by mouth daily. 02/04/20  Yes Luking, Elayne Snare, MD  benzonatate (TESSALON) 100 MG capsule Take 1 capsule (100 mg total) by mouth every 8 (eight) hours as needed for cough. 02/22/20   Amandeep Nesmith E, PA-C  doxycycline (VIBRAMYCIN) 100 MG capsule Take 1 capsule (100 mg total) by mouth 2 (two) times daily. 02/22/20   Yunuen Mordan, Harley Hallmark, PA-C    Allergies    Codeine  Review of Systems   Review of Systems All other systems are reviewed and are negative for acute change except as noted in the HPI.  Physical Exam Updated Vital Signs BP 130/64 (BP Location: Right Arm)   Pulse (!) 105   Temp 99.3 F (37.4 C) (Oral)   Resp (!) 22   Ht 6' (1.829 m)   Wt 129.3 kg   SpO2 95%   BMI 38.65 kg/m   Physical Exam Vitals and nursing note reviewed.  Constitutional:      General: He is not in acute  distress.    Appearance: He is obese. He is not ill-appearing or toxic-appearing.  HENT:     Head: Normocephalic and atraumatic.     Right Ear: Tympanic membrane and external ear normal.     Left Ear: Tympanic membrane and external ear normal.     Nose: Nose normal.     Mouth/Throat:     Mouth: Mucous membranes are moist.     Pharynx: Oropharynx is clear.  Eyes:     General: No scleral icterus.       Right eye: No discharge.        Left eye: No discharge.     Extraocular Movements: Extraocular movements intact.     Conjunctiva/sclera: Conjunctivae normal.     Pupils: Pupils are equal, round, and reactive to light.  Neck:     Vascular: No JVD.  Cardiovascular:     Rate and Rhythm:  Normal rate and regular rhythm.     Pulses: Normal pulses.          Radial pulses are 2+ on the right side and 2+ on the left side.     Heart sounds: Normal heart sounds.  Pulmonary:     Comments: Patient is tachypneic. Lungs clear to auscultation in all fields. Symmetric chest rise. No wheezing, rales, or rhonchi. He is speaking in full sentences. Oxygen saturation is 94% on room air. Chest:     Chest wall: No tenderness.  Abdominal:     Comments: Abdomen is soft, non-distended, and non-tender in all quadrants. No rigidity, no guarding. No peritoneal signs.  Musculoskeletal:        General: Normal range of motion.     Cervical back: Normal range of motion.  Skin:    General: Skin is warm and dry.     Capillary Refill: Capillary refill takes less than 2 seconds.     Findings: No rash.  Neurological:     Mental Status: He is oriented to person, place, and time.     GCS: GCS eye subscore is 4. GCS verbal subscore is 5. GCS motor subscore is 6.     Comments: Fluent speech, no facial droop.  Psychiatric:        Behavior: Behavior normal.       ED Results / Procedures / Treatments   Labs (all labs ordered are listed, but only abnormal results are displayed) Labs Reviewed  RESPIRATORY PANEL BY RT PCR (FLU A&B, COVID) - Abnormal; Notable for the following components:      Result Value   SARS Coronavirus 2 by RT PCR POSITIVE (*)    All other components within normal limits  BASIC METABOLIC PANEL - Abnormal; Notable for the following components:   Glucose, Bld 101 (*)    Calcium 8.5 (*)    All other components within normal limits  D-DIMER, QUANTITATIVE (NOT AT [facility]) - Abnormal; Notable for the following components:   D-Dimer, Quant 0.56 (*)    All other components within normal limits  CBC WITH DIFFERENTIAL/PLATELET    EKG None  Radiology DG Chest 2 View  Result Date: 02/22/2020 CLINICAL DATA:  Cough, shortness of breath EXAM: CHEST - 2 VIEW COMPARISON:  2016 FINDINGS: Low  lung volumes. Patchy bilateral opacities superimposed on chronic interstitial prominence. No pleural effusion or pneumothorax. Cardiomediastinal contours are within normal limits technique. Chronic unhealed right clavicle fracture. IMPRESSION: Patchy bilateral opacities superimposed on chronic interstitial prominence. This may reflect edema or atypical pneumonia in the appropriate setting. Electronically Signed  By: Macy Mis M.D.   On: 02/22/2020 16:35   CT Angio Chest PE W and/or Wo Contrast  Result Date: 02/22/2020 CLINICAL DATA:  Shortness of breath and tachycardia EXAM: CT ANGIOGRAPHY CHEST WITH CONTRAST TECHNIQUE: Multidetector CT imaging of the chest was performed using the standard protocol during bolus administration of intravenous contrast. Multiplanar CT image reconstructions and MIPs were obtained to evaluate the vascular anatomy. CONTRAST:  142mL OMNIPAQUE IOHEXOL 350 MG/ML SOLN COMPARISON:  Chest radiograph February 22, 2020 FINDINGS: Cardiovascular: There is no demonstrable pulmonary embolus. There is no appreciable thoracic aortic aneurysm or dissection. Visualized great vessels appear normal. Note that the right innominate and left common carotid arteries arise as a common trunk, an anatomic variant. There is no pericardial effusion or pericardial thickening. Mediastinum/Nodes: Visualized thyroid appears unremarkable. There is a subcarinal lymph node measuring 1.3 x 1.0 cm. No other adenopathy evident. There are several subcentimeter mediastinal lymph nodes which do not meet size criteria for pathologic significance. No esophageal lesions are evident. Lungs/Pleura: There is ill-defined airspace opacity throughout the lungs fairly diffusely. No cavitary lesions are evident. No frank consolidation. No pleural effusions are evident. Upper Abdomen: There is hepatic steatosis. Visualized upper abdominal structures otherwise appear unremarkable. Musculoskeletal: There are no blastic or lytic bone  lesions. No chest wall lesions evident. Review of the MIP images confirms the above findings. IMPRESSION: 1. No demonstrable pulmonary embolus. No thoracic aortic aneurysm or dissection. 2. Extensive airspace opacity throughout the lungs bilaterally. No consolidation. Suspect atypical organism pneumonia. In this regard, advise assessment of COVID-19 status. 3. Slightly enlarged subcarinal lymph node which may well be of reactive etiology given the parenchymal lung abnormalities. No other adenopathy by size criteria evident. 4.  Hepatic steatosis. Electronically Signed   By: Lowella Grip III M.D.   On: 02/22/2020 20:15    Procedures Procedures (including critical care time)  Medications Ordered in ED Medications  benzonatate (TESSALON) capsule 200 mg (200 mg Oral Given 02/22/20 1912)  cefTRIAXone (ROCEPHIN) 1 g in sodium chloride 0.9 % 100 mL IVPB (0 g Intravenous Stopped 02/22/20 1948)  azithromycin (ZITHROMAX) 500 mg in sodium chloride 0.9 % 250 mL IVPB (0 mg Intravenous Stopped 02/22/20 2113)  iohexol (OMNIPAQUE) 350 MG/ML injection 100 mL (100 mLs Intravenous Contrast Given 02/22/20 1955)  dexamethasone (DECADRON) injection 10 mg (10 mg Intravenous Given 02/22/20 2121)    ED Course  I have reviewed the triage vital signs and the nursing notes.  Pertinent labs & imaging results that were available during my care of the patient were reviewed by me and considered in my medical decision making (see chart for details).    MDM Rules/Calculators/A&P                      Patient seen and examined. Patient presents awake, alert, hemodynamically stable, afebrile, non toxic.  He was noted to be tachycardic to 107 and tachypneic in triage.  On my exam his lungs are clear to auscultation all fields.  He is still tachypneic and heart rate is ranging from 99-106.  He is speaking in full sentences.  No abdominal tenderness.  Labs were collected in triage.  I viewed results which show no leukocytosis, no  anemia.  BMP without severe electrolyte derangement, no renal insufficiency.  Chest x-ray also ordered in triage.  I viewed image which shows patchy bilateral opacities superimposed on chronic interstitial prominence. Given patient's tachypnea and tachycardia D-dimer was ordered and is elevated at 0.56.  CTA chest performed and is negative for a PE does have findings to suggest bilateral pneumonia.  Patient tested positive for Covid.  Patient ambulated without hypoxia, respiratory rates above 94% on room air.  He did feel short of breath and tachypneic with ambulation.  I discussed results and findings with patient.  He is eager to be discharged home.  I discussed my concern with his tachypnea and borderline hypoxia.  He feels he can manage his symptoms at home.  Patient given IV Decadron here in the emergency department.  Will discharge home with antibiotic coverage for the pneumonia given he has had low-grade fevers at home.  Patient also given incentive spirometer and instructed on use by RN.  I checked patient's vitals prior to discharge and he had oxygen saturation of 95% on room air, heart rate was in the 90s and respiratory rate 20.  The patient appears reasonably screened and/or stabilized for discharge and I doubt any other medical condition or other [facility] requiring further screening, evaluation, or treatment in the ED at this time prior to discharge. The patient is safe for discharge with very strict return precautions discussed including worsening shortness of breath or difficulty breathing, fever not controlled with Tylenol, unable to tolerate p.o. intake. Recommend close pcp follow up.  Ryan Carey was evaluated in Emergency Department on 02/22/2020 for the symptoms described in the history of present illness. He was evaluated in the context of the global COVID-19 pandemic, which necessitated consideration that the patient might be at risk for infection with the SARS-CoV-2 virus that causes  COVID-19. Institutional protocols and algorithms that pertain to the evaluation of patients at risk for COVID-19 are in a state of rapid change based on information released by regulatory bodies including the CDC and federal and state organizations. These policies and algorithms were followed during the patient's care in the ED.   Portions of this note were generated with [employer]. Dictation errors may occur despite best attempts at proofreading.   Final Clinical Impression(s) / ED Diagnoses Final diagnoses:  [id number] virus infection    Rx / DC Orders ED Discharge Orders         Ordered    doxycycline (VIBRAMYCIN) 100 MG capsule  2 times daily     02/22/20 2111    benzonatate (TESSALON) 100 MG capsule  Every 8 hours PRN     02/22/20 2124           Flint Melter 02/22/20 2130    Fredia Sorrow, MD 02/26/20 1222

## 2020-02-22 NOTE — Discharge Instructions (Addendum)
Prescription sent to the pharmacy for doxycycline.  This antibiotic used to treat pneumonia.  Please take as prescribed.  Thank you for allowing [place] to care for you today.   Please return to the emergency department if you have any new or worsening symptoms.  You tested positive for covid-19 today.   Medications- You can take medications to help treat your symptoms: -Tylenol for fever and body aches. Please take as prescribed on the bottle. -Over the coutner cough medicine such as mucinex, robitussin, or other brands. -Flonase or saline nasal spray for nasal congestion -Vitamins as recommended by CDC  Treatment- This is a virus and unfortunately there are no antibitotics approved to treat this virus at this time. It is important to monitor your symptoms closely: -You should have a theremometer at home to check your temperature when feeling feverish. -Use a pulse ox meter to measure your oxygen when feeling short of breath.  -If your fever is over 100.4 despite taking tylenol or if your oxygen level drops below 94% these are reasons to rturn to the emergency department for further evaluation. Please call the emergency department before you come to make [place] aware.    We recommend you self-isolate for 10 days and to inform your work/family/friends that you has the virus.  They will need to self-quarantine for 14 days to monitor for symptoms.    Again: symptoms of shortness of breath, chest pain, difficulty breathing, new onset of confusion, any symptoms that are concerning. If any of these symptoms you should come to emergency department for evaluation.   I hope you feel better soon

## 2020-02-23 ENCOUNTER — Telehealth: Payer: Self-pay | Admitting: Physician Assistant

## 2020-02-23 NOTE — Telephone Encounter (Signed)
Called to discuss with Osvaldo Shipper about Covid symptoms and the use of bamlanivimab/etesevimab or casirivimab/imdevimab, a monoclonal antibody infusion for those with mild to moderate Covid symptoms and at a high risk of hospitalization.     Pt is qualified for this infusion at the [facility] infusion center due to co-morbid conditions and/or a member of an at-risk group (BMI>35), however would like to think more about the infusion at this time. Symptoms tier reviewed as well as criteria for ending isolation.  Symptoms reviewed that would warrant ED/Hospital evaluation. Preventative practices reviewed. Patient verbalized understanding. Patient advised to call back if he decides that he does want to get infusion. Callback number to the infusion center given. Patient advised to go to Urgent care or ED with severe symptoms. Last date pt would be eligible for infusion is 5/1. He was given the hotline number to call if he decides he wants it.   Patient Active Problem List   Diagnosis Date Noted  . Elevated transaminase level 12/24/2017  . BMI 35.0-35.9,adult 12/24/2017  . Fatty liver 12/24/2017  . 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

## 2020-02-23 NOTE — Telephone Encounter (Signed)
Discussed with pt about infusion and pt states his beathing is good unless he tries to walk or talks a lot then he gets short of breath. Some chest pain when coughing a lot. He has not picked up tessalon or doxy yet that hospital prescribed for him last night but states he will

## 2020-02-23 NOTE — Telephone Encounter (Signed)
Please make sure patient is aware that if he feels like things are getting extremely worse to go back to the ER and if having intermittent trouble tomorrow to notify us

## 2020-02-23 NOTE — Telephone Encounter (Signed)
Nurses please see previous documentation from the PA  please notify patient that in my opinion infusion treatments are very safe and can be effective at treating Covid pneumonia and lessening the severe side effects of Covid I would recommend that the patient do the infusion Also please see how the patient is doing currently with his breathing

## 2020-02-23 NOTE — Telephone Encounter (Signed)
Patient notified and verbalized understanding. 

## 2020-03-17 ENCOUNTER — Telehealth: Payer: Self-pay | Admitting: Family Medicine

## 2020-03-17 NOTE — Telephone Encounter (Signed)
[facility] contacted but message states they are closed Fridays. Open Mon-Thurs 830-09/1229-5.  Contacted patient  and informed patient of usage requirements. Pt verbalized understanding. Pt states he just received his CPAP about 2 or 3 nights ago and has been using it every night. Pt states he can already tell a difference and feels well rested and can breath better. Pt has appt on 04/19/20. Pt states that a form is to be faxed over before appt for our office to be able to see/download his usage. Pt asked that when we call [facility] on Monday that we ask them to go ahead and fax that form.

## 2020-03-17 NOTE — Telephone Encounter (Signed)
Nurses Please see sheet from [facility] Note that this patient is being treated with CPAP from this organization. This organization sent [place] information stating that the patient must have a clinical evaluation follow-up between June 18 and June 12, 2020 Also patient must use at least 4 hours per night at least 70% of the nights during a 30-day.  During the first 3 months.  Please connect with this organization to see if they can do a download of his CPAP usage?  Also connect with the patient in tell him the usage requirement stated above.  This is so his insurance will pay for the CPAP otherwise the full cost could be on him.  And also it is necessary for the patient to follow-up mid July in order to document appropriately his usage.  When this follow-up visit is completed the progress notes and any additional information regarding his success in improvement of symptoms and usage will be sent to the following: [address] [id number]  Phone number [phone] Fax [phone]

## 2020-03-20 NOTE — Telephone Encounter (Signed)
Thank you for that update Respectfully request nurses to go ahead and follow through with connecting with the CPAP equipment provider regarding having we get a download by the time of his follow-up office visit? Please handle thank you

## 2020-03-20 NOTE — Telephone Encounter (Signed)
Thank you :)

## 2020-03-20 NOTE — Telephone Encounter (Signed)
Called [facility]  [phone]) and was told he has only had the cpap since may 18th so it would do no good to send the download today. They recommend having at least 30 days of information. His appt is June 23rd and I will leave note in basket for nurses to call back on June 21st so it will ve over 30 days and will have by the 23rd. Form is also at nurse station with a note to hold for appt.

## 2020-04-17 NOTE — Telephone Encounter (Signed)
Called and asked to have download faxed to office. Await fax.

## 2020-04-17 NOTE — Telephone Encounter (Signed)
Could not read fax. I called back and asked for download to be sent to my [facility] email and it was. Download at nurse station to hold for appt on Wednesday this week.

## 2020-04-18 NOTE — Telephone Encounter (Signed)
So noted thank you 

## 2020-04-19 ENCOUNTER — Ambulatory Visit: Payer: 59 | Admitting: Family Medicine

## 2020-04-19 ENCOUNTER — Telehealth: Payer: Self-pay | Admitting: *Deleted

## 2020-04-19 DIAGNOSIS — Z029 Encounter for administrative examinations, unspecified: Secondary | ICD-10-CM

## 2020-04-19 NOTE — Telephone Encounter (Signed)
Pt no showed appt today. We had a form from [facility] that stated pt needed a follow up appt. I had called and got his compliance report/download and since pt did not come in today. I sent it to medical records to be scanned.

## 2020-04-21 ENCOUNTER — Encounter: Payer: Self-pay | Admitting: Family Medicine

## 2021-03-13 ENCOUNTER — Ambulatory Visit: Payer: 59 | Admitting: Family Medicine

## 2021-03-22 ENCOUNTER — Encounter: Payer: Self-pay | Admitting: Family Medicine

## 2021-11-10 IMAGING — US US ABDOMEN LIMITED
1 series · 14 of 25 positions shown · non-contrast
Comparison: None.

CLINICAL DATA: 23-year-old male with a history right upper quadrant
pain

EXAM:
ULTRASOUND ABDOMEN LIMITED RIGHT UPPER QUADRANT

[Series 1: us abdomen limited · 0.22mm/px · 14 of 98 slices shown]
[im 1/98]
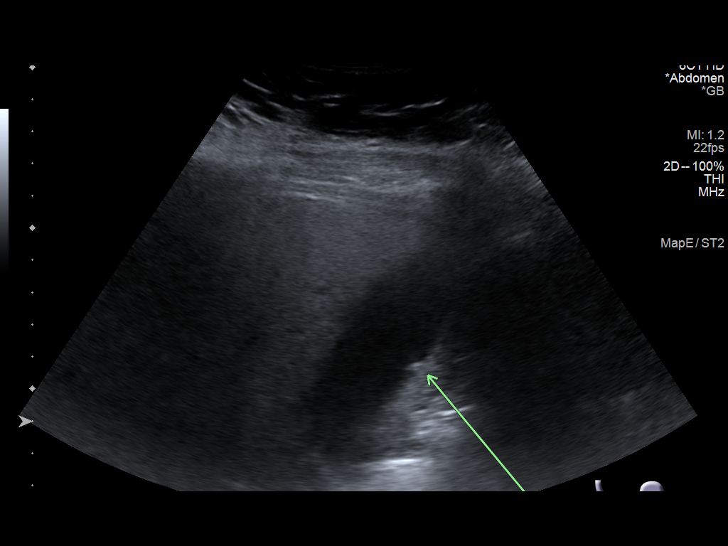
[im 9/98]
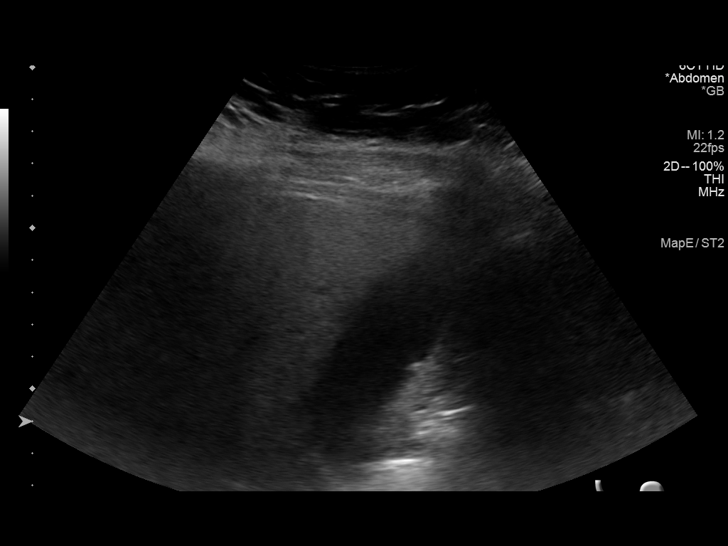
[im 17/98]
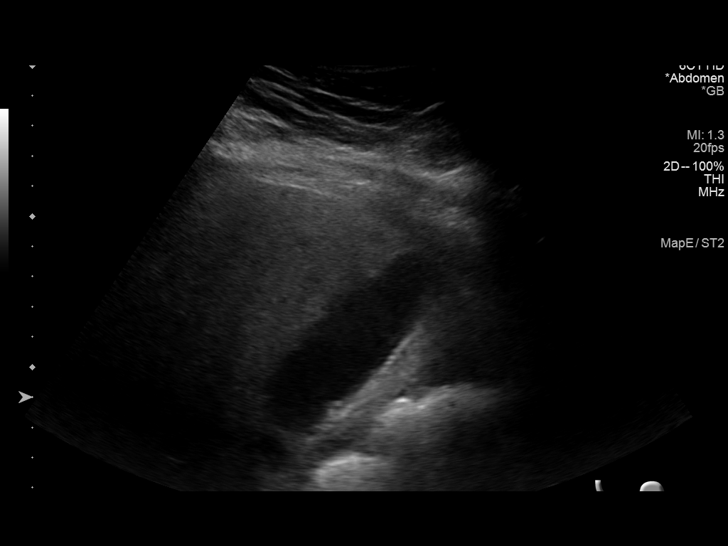
[im 25/98]
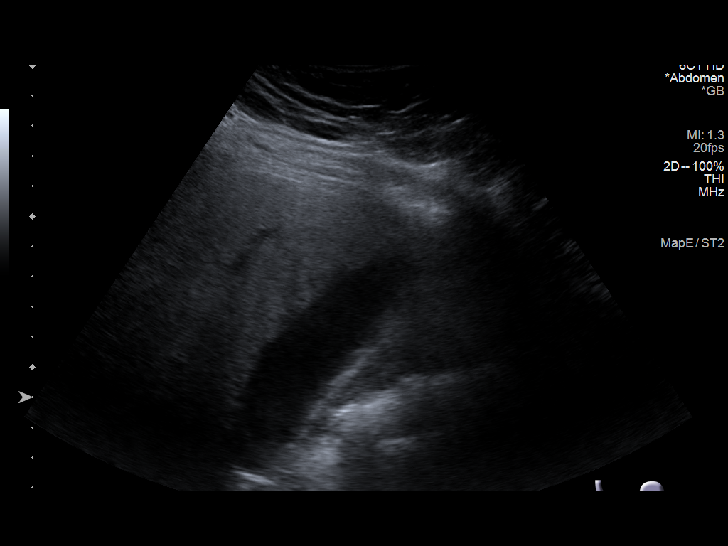
[im 33/98]
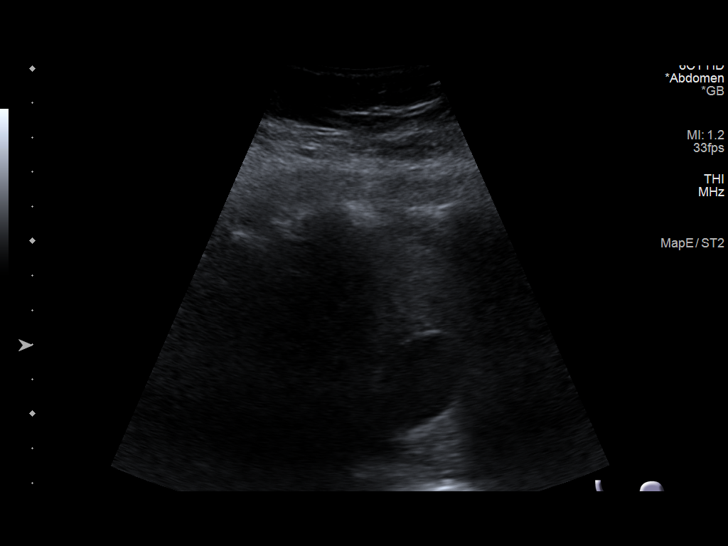
[im 37/98]
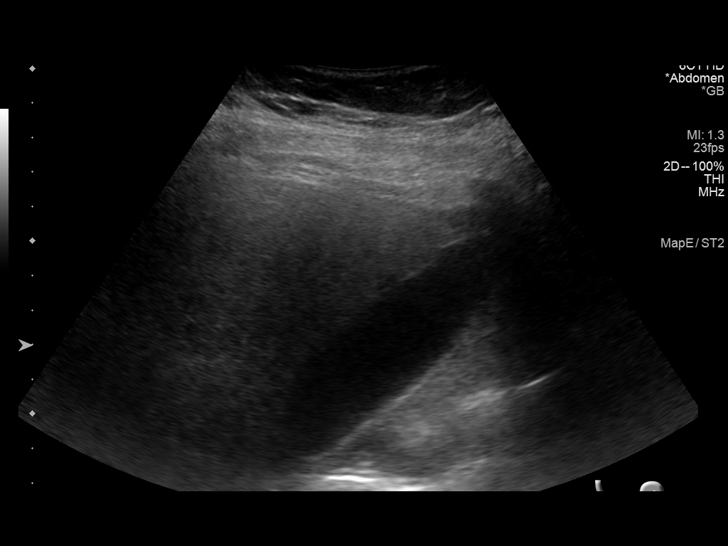
[im 45/98]
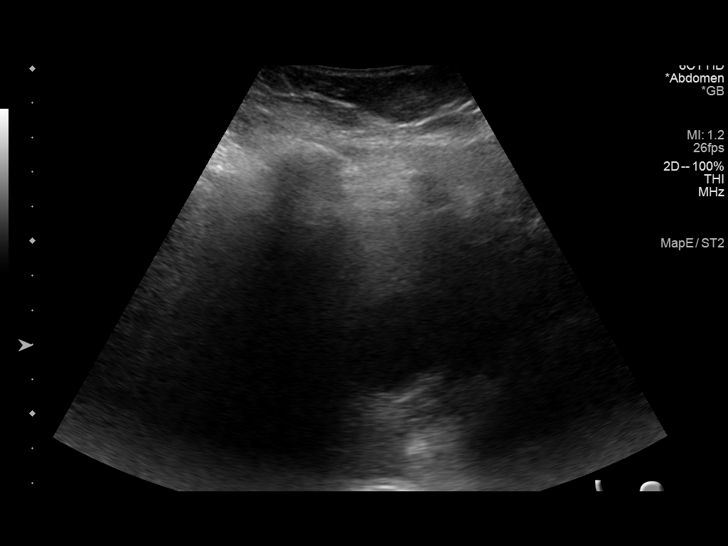
[im 53/98]
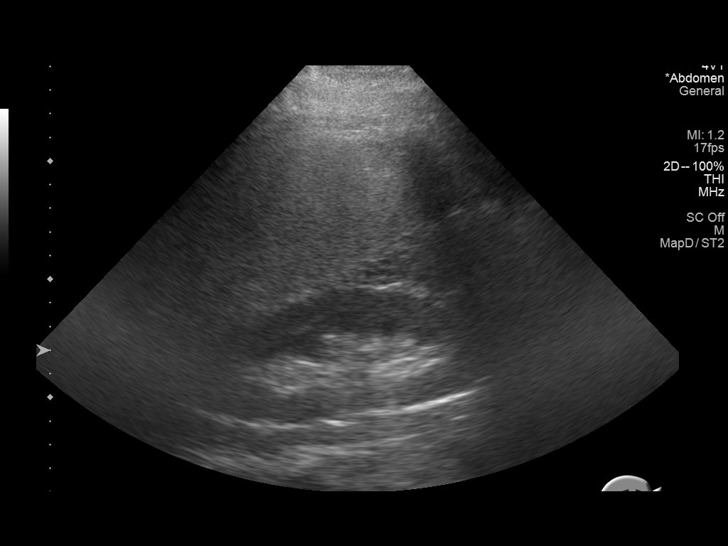
[im 61/98]
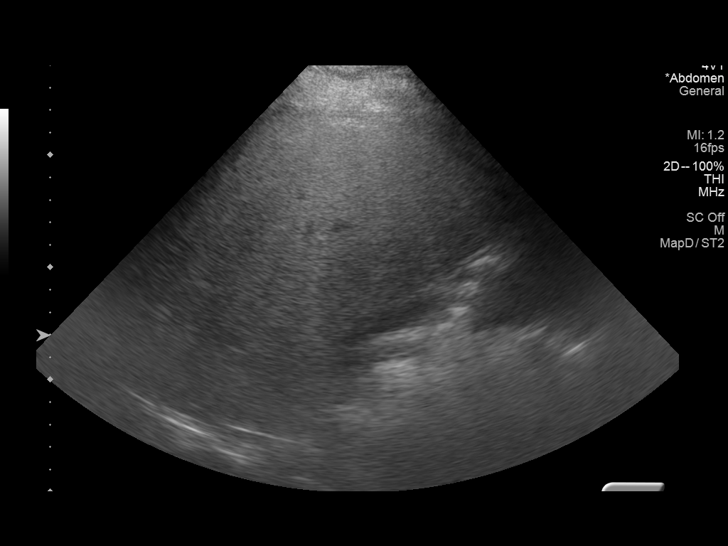
[im 65/98]
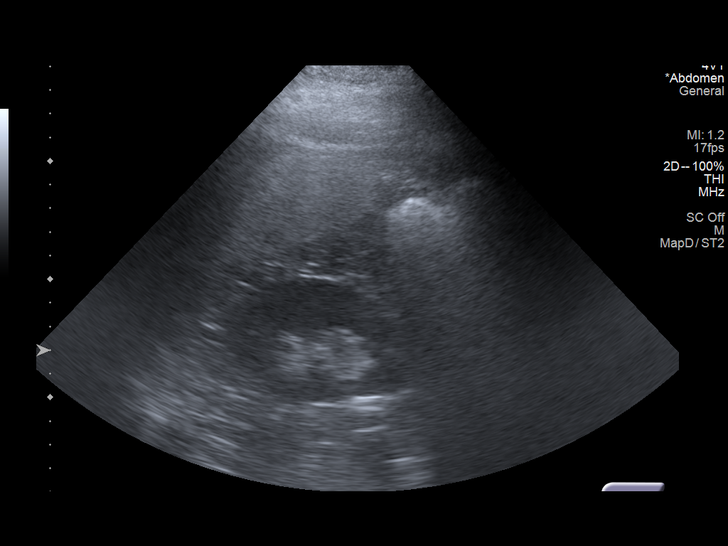
[im 73/98]
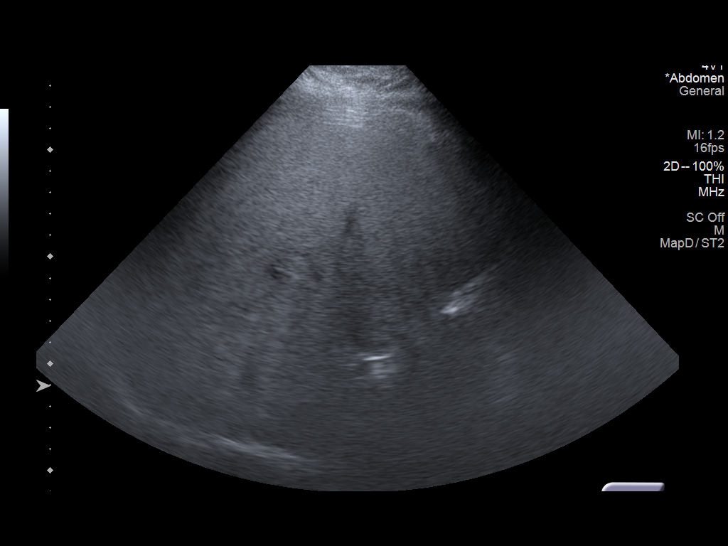
[im 81/98]
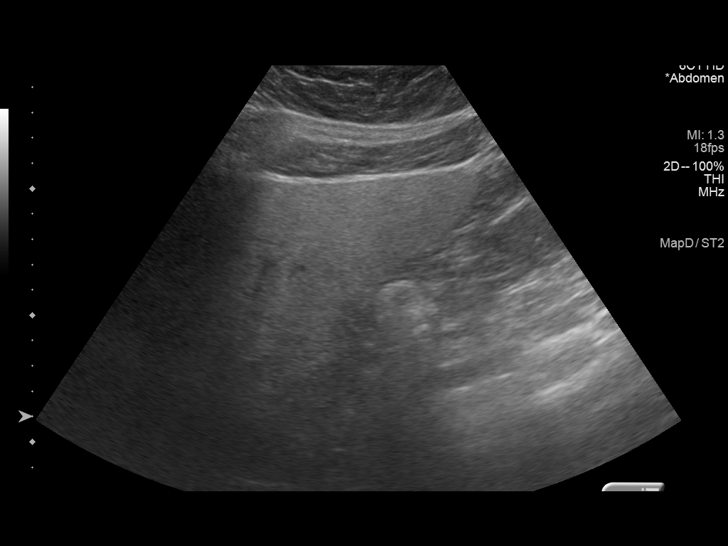
[im 89/98]
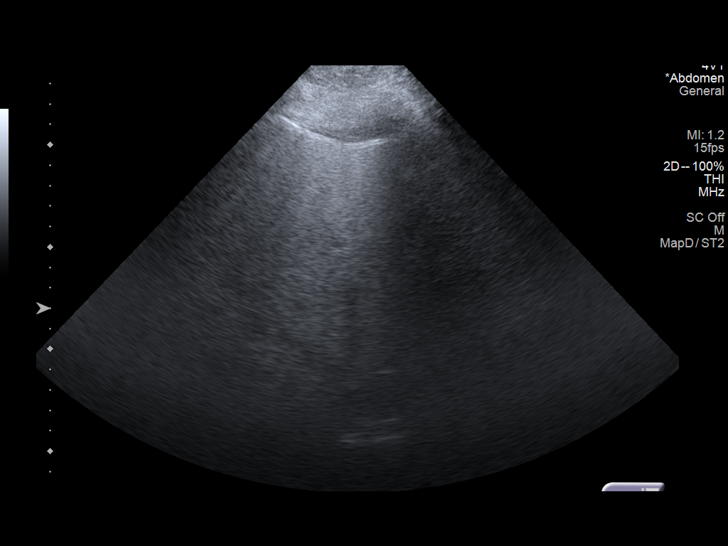
[im 98/98]
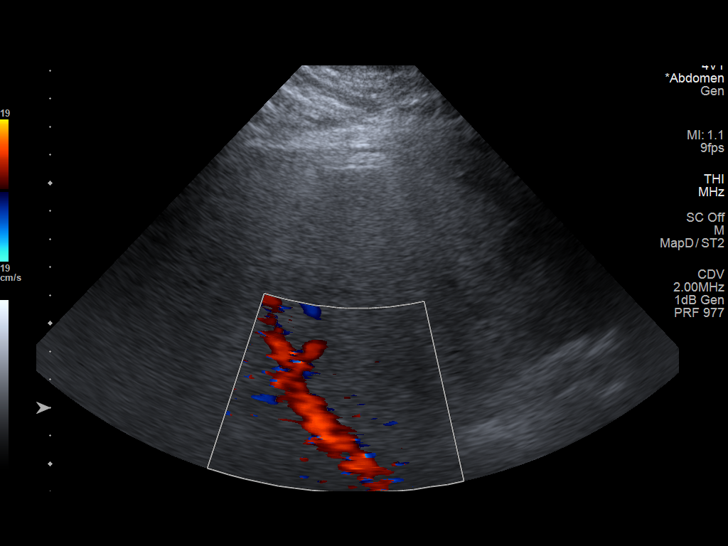

[14 of 25 positions shown; findings below may reference images not displayed]

FINDINGS: Gallbladder:

No gallbladder wall thickening. Negative sonographic Murphy's sign.
No pericholecystic fluid. Hyperechoic focus on the dependent
gallbladder wall measures 4 mm suggestive of a polyp.

Common bile duct:

Diameter: 4 mm-5 mm

Liver:

Increased echogenicity/echotexture of liver parenchyma. Portal vein
is patent on color Doppler imaging with normal direction of blood
flow towards the liver.

Other: None.
IMPRESSION: Negative for cholelithiasis.

Gallbladder polyp measures 4 mm. Follow-up ultrasound in 12 months
is reasonable.

Liver steatosis.

## 2022-07-05 LAB — BASIC METABOLIC PANEL
BUN: 22 — AB (ref 4–21)
Creatinine: 1 (ref 0.6–1.3)

## 2022-07-05 LAB — HEPATIC FUNCTION PANEL
ALT: 96 U/L — AB (ref 10–40)
AST: 57 — AB (ref 14–40)

## 2022-07-05 LAB — LIPID PANEL
Cholesterol: 270 — AB (ref 0–200)
HDL: 17 — AB (ref 35–70)
LDL Cholesterol: 217
Triglycerides: 183 — AB (ref 40–160)

## 2022-07-05 LAB — TESTOSTERONE: Testosterone: 42

## 2022-07-15 LAB — COMPREHENSIVE METABOLIC PANEL: Calcium: 9.5 (ref 8.7–10.7)

## 2022-09-05 ENCOUNTER — Ambulatory Visit: Payer: 59 | Admitting: "Endocrinology

## 2022-09-06 ENCOUNTER — Encounter: Payer: Self-pay | Admitting: "Endocrinology

## 2022-09-06 ENCOUNTER — Ambulatory Visit: Payer: 59 | Admitting: "Endocrinology

## 2022-09-06 VITALS — BP 138/86 | HR 80 | Ht 71.5 in | Wt 264.4 lb

## 2022-09-06 DIAGNOSIS — E782 Mixed hyperlipidemia: Secondary | ICD-10-CM

## 2022-09-06 DIAGNOSIS — E291 Testicular hypofunction: Secondary | ICD-10-CM

## 2022-09-06 DIAGNOSIS — R7303 Prediabetes: Secondary | ICD-10-CM | POA: Diagnosis not present

## 2022-09-06 DIAGNOSIS — Z6836 Body mass index (BMI) 36.0-36.9, adult: Secondary | ICD-10-CM

## 2022-09-06 NOTE — Patient Instructions (Signed)

## 2022-09-06 NOTE — Progress Notes (Unsigned)
09/06/22                                                Endocrinology Consult Note   Ryan Carey, 26 y.o., male   Chief Complaint  Patient presents with   Establish Care    Low testosterone     Past Medical History:  Diagnosis Date   Chronic headaches    Enuresis    Past Surgical History:  Procedure Laterality Date   CHOLECYSTECTOMY  2021   Social History   Socioeconomic History   Marital status: Single    Spouse name: Not on file   Number of children: Not on file   Years of education: Not on file   Highest education level: Not on file  Occupational History   Not on file  Tobacco Use   Smoking status: Never   Smokeless tobacco: Former    Types: Snuff    Quit date: 11/28/2018  Vaping Use   Vaping Use: Never used  Substance and Sexual Activity   Alcohol use: Not Currently    Comment: occ   Drug use: No   Sexual activity: Not on file  Other Topics Concern   Not on file  Social History Narrative   Not on file   Social Determinants of Health   Financial Resource Strain: Not on file  Food Insecurity: Not on file  Transportation Needs: Not on file  Physical Activity: Not on file  Stress: Not on file  Social Connections: Not on file   Outpatient Encounter Medications as of 09/06/2022  Medication Sig   atorvastatin (LIPITOR) 10 MG tablet Take 10 mg by mouth daily.   amphetamine-dextroamphetamine (ADDERALL) 20 MG tablet Take 20 mg by mouth 2 (two) times daily.   meloxicam (MOBIC) 15 MG tablet Take 15 mg by mouth daily as needed.   sertraline (ZOLOFT) 50 MG tablet Take 1 tablet (50 mg total) by mouth daily.   [DISCONTINUED] benzonatate (TESSALON) 100 MG capsule Take 1 capsule (100 mg total) by mouth every 8 (eight) hours as needed for cough.   [DISCONTINUED] doxycycline (VIBRAMYCIN) 100 MG capsule Take 1 capsule (100 mg total) by mouth 2 (two) times daily.   [DISCONTINUED] esomeprazole (NEXIUM) 40 MG capsule Take 1 capsule (40 mg total) by mouth  daily.   [DISCONTINUED] lisinopril (ZESTRIL) 5 MG tablet Take 1 tablet (5 mg total) by mouth daily.   No facility-administered encounter medications on file as of 09/06/2022.   ALLERGIES: Allergies  Allergen Reactions   Codeine Other (See Comments)     HYDRO- Codone caused the  chest pain    VACCINATION STATUS: Immunization History  Administered Date(s) Administered   Meningococcal Conjugate 12/22/2008   Tdap 12/22/2008, 04/06/2019    HPI: Ryan Carey is a 26 y.o.-year-old man, being seen in consultation for evaluation and management of low testosterone requested by by his   provider  [place]. Meinrad Nation, MD.  He was found to have hypogonadism with total testostetone of 42 on July 05, 2022.   He admits for decreased libido. Has difficulty obtaining or maintaining an erection. He denies  trauma to testes,  chemotherapy,  testicular irradiation,  nor genitourinary surgery. No h/o cryptorchidism. He denies history of  mumps orchitis/ history of  autoimmune disorders.  He grew and went through puberty like his peers. No personal  history of  infertility - has 1 biological children he is sure about , 1 more he is not sure about. No incomplete/delayed sexual development.     No breast discomfort/gynecomastia. No abnormal sense of smell (only allergies). No hot flushes. No vision problems.  No report of changing headaches. No FH of hypogonadism/infertility . No Family history of hemochromatosis or pituitary tumors. No recent rapid weight change. No chronic diseases. No chronic pain. Not on opiates, does not take steroids.  No more than 2 drinks a day of alcohol at a time, and this is rarely. No anabolic steroids use. No herbal medicines. Not on antidepressants. He has medical history significant for prediabetes, severe dyslipidemia, obesity.  He is atorvastatin 10 mg p.o. nightly. .   ROS: Constitutional: no weight gain/loss, no fatigue, no subjective  hyperthermia/hypothermia Eyes: no blurry vision, no xerophthalmia ENT: no sore throat, no nodules palpated in throat, no dysphagia/odynophagia, no hoarseness Cardiovascular: no CP/SOB/palpitations/leg swelling Respiratory: no cough/SOB Gastrointestinal: no N/V/D/C Musculoskeletal: no muscle/joint aches Skin: no rashes Neurological: no tremors/numbness/tingling/dizziness Psychiatric: no depression/anxiety  PE: BP 138/86   Pulse 80   Ht 5' 11.5" (1.816 m)   Wt 264 lb 6.4 oz (119.9 kg)   BMI 36.36 kg/m  Wt Readings from Last 3 Encounters:  09/06/22 264 lb 6.4 oz (119.9 kg)  02/22/20 285 lb (129.3 kg)  12/27/19 297 lb (134.7 kg)   Constitutional:  Body mass index is 36.36 kg/m.,  not in acute distress, + Normal State of Mind Eyes: PERRLA, EOMI, no exophthalmos ENT: moist mucous membranes, no thyromegaly, no cervical lymphadenopathy Cardiovascular: RRR, No MRG Respiratory: CTA B Gastrointestinal: abdomen soft, NT, ND, BS+ Musculoskeletal: no deformities, strength intact in all 4 Skin: moist, warm, no rashes Neurological: no tremor with outstretched hands, DTR normal in all 4 Genital exam: normal male escutcheon, no inguinal LAD, normal phallus, testes ~20 mL, no testicular masses, no penile discharge.  No gynecomastia.   CMP ( most recent) CMP     Component Value Date/Time   NA 136 02/22/2020 1735   NA 140 12/27/2019 1422   K 3.8 02/22/2020 1735   CL 102 02/22/2020 1735   CO2 23 02/22/2020 1735   GLUCOSE 101 (H) 02/22/2020 1735   BUN 14 02/22/2020 1735   BUN 15 12/27/2019 1422   CREATININE 0.80 02/22/2020 1735   CALCIUM 8.5 (L) 02/22/2020 1735   PROT 7.5 12/27/2019 1422   ALBUMIN 4.4 12/27/2019 1422   AST 41 (H) 12/27/2019 1422   ALT 106 (H) 12/27/2019 1422   ALKPHOS 63 12/27/2019 1422   BILITOT 0.6 12/27/2019 1422   GFRNONAA >60 02/22/2020 1735   GFRAA >60 02/22/2020 1735     Diabetic Labs (most recent): Lab Results  Component Value Date   HGBA1C 5.9 (H)  12/27/2019         Lab Results  Component Value Date   TSH 1.980 12/27/2019   TSH 1.920 12/24/2017    Recent Results (from the past 2160 hour(s))  Basic metabolic panel     Status: Abnormal   Collection Time: 07/05/22 12:00 AM  Result Value Ref Range   BUN 22 (A) 4 - 21   Creatinine 1.0 0.6 - 1.3  Comprehensive metabolic panel     Status: None   Collection Time: 07/05/22 12:00 AM  Result Value Ref Range   Calcium 9.5 8.7 - 10.7  Lipid panel     Status: Abnormal   Collection Time: 07/05/22 12:00 AM  Result Value Ref Range  Triglycerides 183 (A) 40 - 160   Cholesterol 270 (A) 0 - 200   HDL 17 (A) 35 - 70   LDL Cholesterol 217   Hepatic function panel     Status: Abnormal   Collection Time: 07/05/22 12:00 AM  Result Value Ref Range   ALT 96 (A) 10 - 40 U/L   AST 57 (A) 14 - 40  Testosterone     Status: None   Collection Time: 07/05/22 12:00 AM  Result Value Ref Range   Testosterone 42      ASSESSMENT: 1. Hypogonadism 2.  Hyperlipidemia 3.  Elevated liver enzymes-fatty liver disease 4.  Prediabetes 5.  Obesity  PLAN:  I have examined the patient, reviewed his labs and have had a long discussion with the patient regarding his testosterone results.   - I have discussed potential causes of hypogonadism, diagnosis of hypogonadism,  and relevant workup to confirm the diagnosis of hypogonadism before initiating testosterone replacement therapy.  -His work-up so far is not complete. -  I also discussed adverse effects of unnecessary testosterone replacement short-term and long-term.  -It is beneficial to determine etiology of hypogonadism before initiation of treatment if possible. -I have approached him for more complete laboratory work including repeat of testosterone (total and free), SHBG; FSH/LH; prolactin, ferritin in a morning sample of serum.    -If this workup indicates  Secondary etiology for hypogonadism, he will be considered for MRI imaging of sella/pituitary  .  -If a diagnosis of hypogonadism is established, he will be started on low-dose testosterone to titrate as necessary.  He was informed about fertility, he is not interested to keep his fertility at this time.  In light of his metabolic dysfunction with multiple manifestations including obesity, prediabetes, severe dyslipidemia, fatty liver disease, this patient will benefit from lifestyle medicine.  - he acknowledges that there is a room for improvement in his food and drink choices. - Suggestion is made for him to avoid simple carbohydrates  from his diet including Cakes, Sweet Desserts, Ice Cream, Soda (diet and regular), Sweet Tea, Candies, Chips, Cookies, Store Bought Juices, Alcohol , Artificial Sweeteners,  Coffee Creamer, and "Sugar-free" Products, Lemonade. This will help patient to have more stable blood glucose profile and potentially avoid unintended weight gain.  The following Lifestyle Medicine recommendations according to [place]  [facility]) were discussed and and offered to patient and he  agrees to start the journey:  A. Whole Foods, Plant-Based Nutrition comprising of fruits and vegetables, plant-based proteins, whole-grain carbohydrates was discussed in detail with the patient.   A list for source of those nutrients were also provided to the patient.  Patient will use only water or unsweetened tea for hydration. B.  The need to stay away from risky substances including alcohol, smoking; obtaining 7 to 9 hours of restorative sleep, at least 150 minutes of moderate intensity exercise weekly, the importance of healthy social connections,  and stress management techniques were discussed. C.  A full color page of  Calorie density of various food groups per pound showing examples of each food groups was provided to the patient.   He is advised to continue his atorvastatin 10 mg p.o. nightly.    He is advised to maintain close follow-up with his PMD. - Time  spent with the patient: 60 minutes, of which >50% was spent in obtaining information about his symptoms, reviewing his previous labs, evaluations, and treatments, counseling him about his hypogonadism, dyslipidemia, prediabetes,  obesity, and developing a plan to confirm the diagnosis and long term treatment as necessary.  Ryan Carey in the discussions, expressed understanding, and voiced agreement with the above plans.  All questions were answered to his satisfaction. he is encouraged to contact clinic should he have any questions or concerns prior to his return visit.  Return for Fasting Labs  in AM B4 8.  Glade Lloyd, MD [facility] [address] [place], [place] [address] Phone: [phone]  Fax: [phone]   09/06/2022, 2:40 PM  This note was partially dictated with voice recognition software. Similar sounding words can be transcribed inadequately or may not  be corrected upon review.

## 2022-09-07 DIAGNOSIS — E291 Testicular hypofunction: Secondary | ICD-10-CM | POA: Insufficient documentation

## 2022-09-07 DIAGNOSIS — E782 Mixed hyperlipidemia: Secondary | ICD-10-CM | POA: Insufficient documentation

## 2022-09-07 DIAGNOSIS — R7303 Prediabetes: Secondary | ICD-10-CM | POA: Insufficient documentation

## 2022-09-17 LAB — CBC WITH DIFFERENTIAL/PLATELET
Basophils Absolute: 0.1 10*3/uL (ref 0.0–0.2)
Basos: 1 %
EOS (ABSOLUTE): 0.1 10*3/uL (ref 0.0–0.4)
Eos: 2 %
Hematocrit: 44.5 % (ref 37.5–51.0)
Hemoglobin: 14.1 g/dL (ref 13.0–17.7)
Immature Grans (Abs): 0 10*3/uL (ref 0.0–0.1)
Immature Granulocytes: 0 %
Lymphocytes Absolute: 2.4 10*3/uL (ref 0.7–3.1)
Lymphs: 39 %
MCH: 26.2 pg — ABNORMAL LOW (ref 26.6–33.0)
MCHC: 31.7 g/dL (ref 31.5–35.7)
MCV: 83 fL (ref 79–97)
Monocytes Absolute: 0.4 10*3/uL (ref 0.1–0.9)
Monocytes: 7 %
Neutrophils Absolute: 3 10*3/uL (ref 1.4–7.0)
Neutrophils: 51 %
Platelets: 301 10*3/uL (ref 150–450)
RBC: 5.39 x10E6/uL (ref 4.14–5.80)
RDW: 14.3 % (ref 11.6–15.4)
WBC: 6 10*3/uL (ref 3.4–10.8)

## 2022-09-17 LAB — TESTOSTERONE, FREE, TOTAL, SHBG
Sex Hormone Binding: 6.9 nmol/L — ABNORMAL LOW (ref 16.5–55.9)
Testosterone, Free: 3 pg/mL — ABNORMAL LOW (ref 9.3–26.5)
Testosterone: 53 ng/dL — ABNORMAL LOW (ref 264–916)

## 2022-09-17 LAB — PSA: Prostate Specific Ag, Serum: 0.3 ng/mL (ref 0.0–4.0)

## 2022-09-17 LAB — FERRITIN: Ferritin: 172 ng/mL (ref 30–400)

## 2022-09-17 LAB — LUTEINIZING HORMONE: LH: 0.9 m[IU]/mL — ABNORMAL LOW (ref 1.7–8.6)

## 2022-09-17 LAB — CORTISOL-AM, BLOOD: Cortisol - AM: 12.8 ug/dL (ref 6.2–19.4)

## 2022-09-17 LAB — FOLLICLE STIMULATING HORMONE: FSH: 1.7 m[IU]/mL (ref 1.5–12.4)

## 2022-09-17 LAB — T4, FREE: Free T4: 1.44 ng/dL (ref 0.82–1.77)

## 2022-09-17 LAB — PROLACTIN: Prolactin: 8 ng/mL (ref 4.0–15.2)

## 2022-09-17 LAB — TSH: TSH: 1.29 u[IU]/mL (ref 0.450–4.500)

## 2022-09-24 ENCOUNTER — Ambulatory Visit: Payer: 59 | Admitting: "Endocrinology

## 2022-09-24 ENCOUNTER — Encounter: Payer: Self-pay | Admitting: "Endocrinology

## 2022-09-24 ENCOUNTER — Ambulatory Visit (INDEPENDENT_AMBULATORY_CARE_PROVIDER_SITE_OTHER): Payer: 59 | Admitting: "Endocrinology

## 2022-09-24 VITALS — BP 148/96 | HR 76 | Ht 71.5 in | Wt 265.4 lb

## 2022-09-24 DIAGNOSIS — E782 Mixed hyperlipidemia: Secondary | ICD-10-CM | POA: Diagnosis not present

## 2022-09-24 DIAGNOSIS — E291 Testicular hypofunction: Secondary | ICD-10-CM | POA: Diagnosis not present

## 2022-09-24 DIAGNOSIS — Z6836 Body mass index (BMI) 36.0-36.9, adult: Secondary | ICD-10-CM

## 2022-09-24 DIAGNOSIS — R7303 Prediabetes: Secondary | ICD-10-CM

## 2022-09-24 MED ORDER — TESTOSTERONE CYPIONATE 100 MG/ML IM SOLN: 50.0000 mg | INTRAMUSCULAR | 0 refills | Status: DC

## 2022-09-24 MED ORDER — SYRINGE/NEEDLE (DISP) 21G X 1-1/2" 3 ML MISC: 1 refills | Status: DC

## 2022-09-24 NOTE — Progress Notes (Signed)
09/06/22          Endocrinology follow-up note                             Ryan Carey, 26 y.o., male   Chief Complaint  Patient presents with   Follow-up    Hypogonadism, male     Past Medical History:  Diagnosis Date   Chronic headaches    Enuresis    Past Surgical History:  Procedure Laterality Date   CHOLECYSTECTOMY  2021   Social History   Socioeconomic History   Marital status: Single    Spouse name: Not on file   Number of children: Not on file   Years of education: Not on file   Highest education level: Not on file  Occupational History   Not on file  Tobacco Use   Smoking status: Never   Smokeless tobacco: Former    Types: Snuff    Quit date: 11/28/2018  Vaping Use   Vaping Use: Never used  Substance and Sexual Activity   Alcohol use: Not Currently    Comment: occ   Drug use: No   Sexual activity: Not on file  Other Topics Concern   Not on file  Social History Narrative   Not on file   Social Determinants of Health   Financial Resource Strain: Not on file  Food Insecurity: Not on file  Transportation Needs: Not on file  Physical Activity: Not on file  Stress: Not on file  Social Connections: Not on file   Outpatient Encounter Medications as of 09/24/2022  Medication Sig   SYRINGE-NEEDLE, DISP, 3 ML 21G X 1-1/2" 3 ML MISC Use to inject testosterone every week   testosterone cypionate (DEPOTESTOTERONE CYPIONATE) 100 MG/ML injection Inject 0.5 mLs (50 mg total) into the muscle every 7 (seven) days. For IM use only   amphetamine-dextroamphetamine (ADDERALL) 20 MG tablet Take 20 mg by mouth 2 (two) times daily.   atorvastatin (LIPITOR) 10 MG tablet Take 10 mg by mouth daily.   meloxicam (MOBIC) 15 MG tablet Take 15 mg by mouth daily as needed.   sertraline (ZOLOFT) 50 MG tablet Take 1 tablet (50 mg total) by mouth daily.   No facility-administered encounter medications on file as of 09/24/2022.   ALLERGIES: Allergies  Allergen  Reactions   Codeine Other (See Comments)     HYDRO- Codone caused the  chest pain    VACCINATION STATUS: Immunization History  Administered Date(s) Administered   Meningococcal Conjugate 12/22/2008   Tdap 12/22/2008, 04/06/2019    HPI: Ryan Carey is a 26 y.o.-year-old man, being seen in consultation for evaluation and management of low testosterone requested by by his   provider  [place] Nation, MD.  He was found to have hypogonadism with total testostetone of 42 on July 05, 2022. His repeat previsit labs show total testosterone of 53.  He admits for decreased libido. Has difficulty obtaining or maintaining an erection. He denies  trauma to testes,  chemotherapy,  testicular irradiation,  nor genitourinary surgery. No h/o cryptorchidism. He denies history of  mumps orchitis/ history of  autoimmune disorders.  He grew and went through puberty like his peers. No personal history of  infertility - has 1 biological children he is sure about , 1 more he is not sure about. No incomplete/delayed sexual development.     No breast discomfort/gynecomastia. No abnormal sense of smell (only allergies).  No hot flushes. No vision problems.  No report of changing headaches. No FH of hypogonadism/infertility . No Family history of hemochromatosis or pituitary tumors. No recent rapid weight change. No chronic diseases. No chronic pain. Not on opiates, does not take steroids.  No more than 2 drinks a day of alcohol at a time, and this is rarely. No anabolic steroids use. No herbal medicines. Not on antidepressants. He has medical history significant for prediabetes, severe dyslipidemia, obesity.  He is on atorvastatin 10 mg p.o. nightly.    ROS: Constitutional: no weight gain/loss, no fatigue, no subjective hyperthermia/hypothermia Eyes: no blurry vision, no xerophthalmia ENT: no sore throat, no nodules palpated in throat, no dysphagia/odynophagia, no hoarseness Cardiovascular: no  CP/SOB/palpitations/leg swelling Respiratory: no cough/SOB Gastrointestinal: no N/V/D/C Musculoskeletal: no muscle/joint aches Skin: no rashes Neurological: no tremors/numbness/tingling/dizziness Psychiatric: no depression/anxiety  PE: BP (!) 148/96   Pulse 76   Ht 5' 11.5" (1.816 m)   Wt 265 lb 6.4 oz (120.4 kg)   BMI 36.50 kg/m  Wt Readings from Last 3 Encounters:  09/24/22 265 lb 6.4 oz (120.4 kg)  09/06/22 264 lb 6.4 oz (119.9 kg)  02/22/20 285 lb (129.3 kg)   Constitutional:  Body mass index is 36.5 kg/m.,  not in acute distress, + Normal State of Mind Eyes: PERRLA, EOMI, no exophthalmos ENT: moist mucous membranes, no thyromegaly, no cervical lymphadenopathy  Genital exam: normal male escutcheon, no inguinal LAD, normal phallus, testes ~20 mL, no testicular masses, no penile discharge.  No gynecomastia.   CMP ( most recent) CMP     Component Value Date/Time   NA 136 02/22/2020 1735   NA 140 12/27/2019 1422   K 3.8 02/22/2020 1735   CL 102 02/22/2020 1735   CO2 23 02/22/2020 1735   GLUCOSE 101 (H) 02/22/2020 1735   BUN 22 (A) 07/05/2022 0000   CREATININE 1.0 07/05/2022 0000   CREATININE 0.80 02/22/2020 1735   CALCIUM 9.5 07/15/2022 0000   PROT 7.5 12/27/2019 1422   ALBUMIN 4.4 12/27/2019 1422   AST 57 (A) 07/05/2022 0000   ALT 96 (A) 07/05/2022 0000   ALKPHOS 63 12/27/2019 1422   BILITOT 0.6 12/27/2019 1422   GFRNONAA >60 02/22/2020 1735   GFRAA >60 02/22/2020 1735     Diabetic Labs (most recent): Lab Results  Component Value Date   HGBA1C 5.9 (H) 12/27/2019         Lab Results  Component Value Date   TSH 1.290 09/13/2022   TSH 1.980 12/27/2019   TSH 1.920 12/24/2017   FREET4 1.44 09/13/2022    Recent Results (from the past 2160 hour(s))  Basic metabolic panel     Status: Abnormal   Collection Time: 07/05/22 12:00 AM  Result Value Ref Range   BUN 22 (A) 4 - 21   Creatinine 1.0 0.6 - 1.3  Lipid panel     Status: Abnormal   Collection  Time: 07/05/22 12:00 AM  Result Value Ref Range   Triglycerides 183 (A) 40 - 160   Cholesterol 270 (A) 0 - 200   HDL 17 (A) 35 - 70   LDL Cholesterol 217   Hepatic function panel     Status: Abnormal   Collection Time: 07/05/22 12:00 AM  Result Value Ref Range   ALT 96 (A) 10 - 40 U/L   AST 57 (A) 14 - 40  Testosterone     Status: None   Collection Time: 07/05/22 12:00 AM  Result Value Ref Range  Testosterone 42   Comprehensive metabolic panel     Status: None   Collection Time: 07/15/22 12:00 AM  Result Value Ref Range   Calcium 9.5 8.7 - 10.7  Prolactin     Status: None   Collection Time: 09/13/22  8:16 AM  Result Value Ref Range   Prolactin 8.0 4.0 - 15.2 ng/mL    Comment: **Effective October 14, 2022 Prolactin reference**   interval will be changing to:              Age           Male (ng/mL)  Male (ng/mL)           0 - 30 days       4.3 - 32.8     4.5 - 24.4           1 -  6 mos        5.4 - 51.4     5.4 - 51.4          5m-  1 yrs        5.3 - 28.9     4.8 - 33.4           2 - 12 yrs        1.8 - 44.2     4.8 - 33.4          13 - 30 yrs        3.6 - 31.5     4.8 - 33.4          31 - 50 yrs        3.9 - 22.7     4.8 - 33.4          51 - 80 yrs        3.6 - 25.2     3.6 - 25.2              >80 yrs        3.6 - 32.0     3.6 - 32.0   Ferritin     Status: None   Collection Time: 09/13/22  8:16 AM  Result Value Ref Range   Ferritin 172 30 - 400 ng/mL  Luteinizing hormone     Status: Abnormal   Collection Time: 09/13/22  8:16 AM  Result Value Ref Range   LH 0.9 (L) 1.7 - 8.6 mIU/mL  Follicle stimulating hormone     Status: None   Collection Time: 09/13/22  8:16 AM  Result Value Ref Range   FSH 1.7 1.5 - 12.4 mIU/mL  CBC with Differential/Platelet     Status: Abnormal   Collection Time: 09/13/22  8:16 AM  Result Value Ref Range   WBC 6.0 3.4 - 10.8 x10E3/uL   RBC 5.39 4.14 - 5.80 x10E6/uL   Hemoglobin 14.1 13.0 - 17.7 g/dL   Hematocrit 44.5 37.5 - 51.0 %   MCV  83 79 - 97 fL   MCH 26.2 (L) 26.6 - 33.0 pg   MCHC 31.7 31.5 - 35.7 g/dL   RDW 14.3 11.6 - 15.4 %   Platelets 301 150 - 450 x10E3/uL   Neutrophils 51 Not Estab. %   Lymphs 39 Not Estab. %   Monocytes 7 Not Estab. %   Eos 2 Not Estab. %   Basos 1 Not Estab. %   Neutrophils Absolute 3.0 1.4 - 7.0 x10E3/uL   Lymphocytes Absolute 2.4 0.7 - 3.1 x10E3/uL   Monocytes Absolute 0.4  0.1 - 0.9 x10E3/uL   EOS (ABSOLUTE) 0.1 0.0 - 0.4 x10E3/uL   Basophils Absolute 0.1 0.0 - 0.2 x10E3/uL   Immature Granulocytes 0 Not Estab. %   Immature Grans (Abs) 0.0 0.0 - 0.1 x10E3/uL  PSA     Status: None   Collection Time: 09/13/22  8:16 AM  Result Value Ref Range   Prostate Specific Ag, Serum 0.3 0.0 - 4.0 ng/mL    Comment: Roche ECLIA methodology. According to the American Urological Association, Serum PSA should decrease and remain at undetectable levels after radical prostatectomy. The AUA defines biochemical recurrence as an initial PSA value 0.2 ng/mL or greater followed by a subsequent confirmatory PSA value 0.2 ng/mL or greater. Values obtained with different assay methods or kits cannot be used interchangeably. Results cannot be interpreted as absolute evidence of the presence or absence of malignant disease.   Testosterone, Free, Total, SHBG     Status: Abnormal   Collection Time: 09/13/22  8:16 AM  Result Value Ref Range   Testosterone 53 (L) 264 - 916 ng/dL    Comment: Adult male reference interval is based on a population of healthy nonobese males (BMI <30) between 45 and 102 years old. [place], [place] [phone]. PMID: 29798921.    Testosterone, Free 3.0 (L) 9.3 - 26.5 pg/mL   Sex Hormone Binding 6.9 (L) 16.5 - 55.9 nmol/L  Cortisol-am, blood     Status: None   Collection Time: 09/13/22  8:16 AM  Result Value Ref Range   Cortisol - AM 12.8 6.2 - 19.4 ug/dL  TSH     Status: None   Collection Time: 09/13/22  8:16 AM  Result Value Ref Range   TSH 1.290 0.450 - 4.500  uIU/mL  T4, free     Status: None   Collection Time: 09/13/22  8:16 AM  Result Value Ref Range   Free T4 1.44 0.82 - 1.77 ng/dL     ASSESSMENT: 1. Hypogonadism 2.  Hyperlipidemia 3.  Elevated liver enzymes-fatty liver disease 4.  Prediabetes 5.  Obesity  PLAN:  I have examined the patient, reviewed his labs and have had a long discussion with the patient regarding his existing and new testosterone results.   In light of his total testosterone at 69, he is a candidate for androgen replacement therapy.  -  I also discussed adverse effects of unnecessary testosterone replacement short-term and long-term.  Serum prolactin and gonadotropins are not remarkable. He will not need imaging of the sella/pituitary. -He will benefit from slow titration of testosterone treatment.  I discussed his options including topical versus injectable testosterone preparations.  He opted for injectable preparations.  I discussed and initiated testosterone 50 mg IM every 7 days with plan to repeat labs and office visit in 3 months.   In light of his metabolic dysfunction with multiple manifestations including obesity, prediabetes, severe dyslipidemia, fatty liver disease, this patient will benefit from lifestyle medicine.  - he acknowledges that there is a room for improvement in his food and drink choices. - Suggestion is made for him to avoid simple carbohydrates  from his diet including Cakes, Sweet Desserts, Ice Cream, Soda (diet and regular), Sweet Tea, Candies, Chips, Cookies, Store Bought Juices, Alcohol , Artificial Sweeteners,  Coffee Creamer, and "Sugar-free" Products, Lemonade. This will help patient to have more stable blood glucose profile and potentially avoid unintended weight gain.  The following Lifestyle Medicine recommendations according to [place]  [facility]) were discussed and and  offered to patient and he  agrees to start the journey:  A. Whole Foods, Plant-Based  Nutrition comprising of fruits and vegetables, plant-based proteins, whole-grain carbohydrates was discussed in detail with the patient.   A list for source of those nutrients were also provided to the patient.  Patient will use only water or unsweetened tea for hydration. B.  The need to stay away from risky substances including alcohol, smoking; obtaining 7 to 9 hours of restorative sleep, at least 150 minutes of moderate intensity exercise weekly, the importance of healthy social connections,  and stress management techniques were discussed. C.  A full color page of  Calorie density of various food groups per pound showing examples of each food groups was provided to the patient.     He is advised to continue his atorvastatin 10 mg p.o. nightly.    He is advised to maintain close follow-up with his PMD.  I spent 26 minutes in the care of the patient today including review of labs from Thyroid Function, CMP, and other relevant labs ; imaging/biopsy records (current and previous including abstractions from other facilities); face-to-face time discussing  his lab results and symptoms, medications doses, his options of short and long term treatment based on the latest standards of care / guidelines;   and documenting the encounter.  Ryan Carey  participated in the discussions, expressed understanding, and voiced agreement with the above plans.  All questions were answered to his satisfaction. he is encouraged to contact clinic should he have any questions or concerns prior to his return visit.  Return in about 3 months (around 12/25/2022) for Fasting Labs  in AM B4 8.  Glade Lloyd, MD [facility] [address] [place], [place] [address] Phone: [phone]  Fax: [phone]   09/24/2022, 6:26 PM  This note was partially dictated with voice recognition software. Similar sounding words can be transcribed inadequately or may not  be  corrected upon review.

## 2022-10-24 ENCOUNTER — Other Ambulatory Visit: Payer: Self-pay | Admitting: "Endocrinology

## 2022-11-20 LAB — LIPID PANEL
Chol/HDL Ratio: 9.7 ratio — ABNORMAL HIGH (ref 0.0–5.0)
Cholesterol, Total: 174 mg/dL (ref 100–199)
HDL: 18 mg/dL — ABNORMAL LOW (ref >39)
LDL Chol Calc (NIH): 136 mg/dL — ABNORMAL HIGH (ref 0–99)
Triglycerides: 107 mg/dL (ref 0–149)
VLDL Cholesterol Cal: 20 mg/dL (ref 5–40)

## 2022-11-20 LAB — TESTOSTERONE, FREE, TOTAL, SHBG
Sex Hormone Binding: 4.9 nmol/L — ABNORMAL LOW (ref 16.5–55.9)
Testosterone, Free: 11.9 pg/mL (ref 9.3–26.5)
Testosterone: 210 ng/dL — ABNORMAL LOW (ref 264–916)

## 2022-12-26 ENCOUNTER — Encounter: Payer: Self-pay | Admitting: "Endocrinology

## 2022-12-26 ENCOUNTER — Ambulatory Visit: Payer: BC Managed Care – PPO | Admitting: "Endocrinology

## 2022-12-26 VITALS — BP 146/98 | HR 100 | Ht 71.5 in | Wt 268.2 lb

## 2022-12-26 DIAGNOSIS — E291 Testicular hypofunction: Secondary | ICD-10-CM | POA: Diagnosis not present

## 2022-12-26 DIAGNOSIS — R7303 Prediabetes: Secondary | ICD-10-CM | POA: Diagnosis not present

## 2022-12-26 DIAGNOSIS — E782 Mixed hyperlipidemia: Secondary | ICD-10-CM | POA: Diagnosis not present

## 2022-12-26 MED ORDER — TESTOSTERONE CYPIONATE 100 MG/ML IM SOLN: 50.0000 mg | INTRAMUSCULAR | 1 refills | Status: DC

## 2022-12-26 NOTE — Patient Instructions (Signed)

## 2022-12-26 NOTE — Progress Notes (Signed)
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   Chronic headaches    Enuresis    Past Surgical History:  Procedure Laterality Date   CHOLECYSTECTOMY  2021   Social History   Socioeconomic History   Marital status: Single    Spouse name: Not on file   Number of children: Not on file   Years of education: Not on file   Highest education level: Not on file  Occupational History   Not on file  Tobacco Use   Smoking status: Never   Smokeless tobacco: Former    Types: Snuff    Quit date: 11/28/2018  Vaping Use   Vaping Use: Never used  Substance and Sexual Activity   Alcohol use: Not Currently    Comment: occ   Drug use: No   Sexual activity: Not on file  Other Topics Concern   Not on file  Social History Narrative   Not on file   Social Determinants of Health   Financial Resource Strain: Not on file  Food Insecurity: Not on file  Transportation Needs: Not on file  Physical Activity: Not on file  Stress: Not on file  Social Connections: Not on file   Outpatient Encounter Medications as of 12/26/2022  Medication Sig   amphetamine-dextroamphetamine (ADDERALL) 20 MG tablet Take 20 mg by mouth 2 (two) times daily.   atorvastatin (LIPITOR) 10 MG tablet Take 10 mg by mouth daily.   meloxicam (MOBIC) 15 MG tablet Take 15 mg by mouth daily as needed.   sertraline (ZOLOFT) 50 MG tablet Take 1 tablet (50 mg total) by mouth daily.   SYRINGE-NEEDLE, DISP, 3 ML 21G X 1-1/2" 3 ML MISC Use to inject testosterone every week   testosterone cypionate (DEPO-TESTOSTERONE) 100 MG/ML injection Inject 0.5-1 mLs (50-100 mg total) into the muscle every 7 (seven) days. Inject 50 mg and 100 mg weekly alternating   [DISCONTINUED] testosterone cypionate (DEPO-TESTOSTERONE) 100  MG/ML injection Inject 0.5 mLs (50 mg total) into the muscle every 7 (seven) days.   No facility-administered encounter medications on file as of 12/26/2022.   ALLERGIES: Allergies  Allergen Reactions   Codeine Other (See Comments)     HYDRO- Codone caused the  chest pain    VACCINATION STATUS: Immunization History  Administered Date(s) Administered   Meningococcal Conjugate 12/22/2008   Tdap 12/22/2008, 04/06/2019    HPI: Ryan Carey is a 27 y.o.-year-old man, being seen in consultation for evaluation and management of low testosterone requested by by his   provider  [place], MD.  He was found to have hypogonadism with total testostetone of 42 on July 05, 2022. His repeat previsit labs show total testosterone of 53.  He was started on testosterone 50 mg IM every 7 days during his last visit.  He returns with  improvement in his testosterone to 210. -He reports modest improvement in his symptoms and libido.  He denies  trauma to testes,  chemotherapy,  testicular irradiation,  nor genitourinary surgery. No h/o cryptorchidism. He denies history of  mumps orchitis/ history of  autoimmune disorders.  He grew and went through puberty like his peers. No personal history of  infertility - has 1 biological children he is sure about , 1 more he is not sure about. No incomplete/delayed sexual development.     No breast discomfort/gynecomastia. No abnormal sense of smell (only allergies). No hot flushes. No vision problems.  No report of changing headaches. No FH of hypogonadism/infertility . No Family history of hemochromatosis or pituitary tumors. No recent rapid weight change. No chronic diseases. No chronic pain. Not on opiates, does not take steroids.  No more than 2 drinks a day of alcohol at a time, and this is rarely. No anabolic steroids use. No herbal medicines. Not on antidepressants. He has medical history significant for prediabetes, severe dyslipidemia,  obesity.  He is on atorvastatin 10 mg p.o. nightly.    ROS: Constitutional: no weight gain/loss, no fatigue, no subjective hyperthermia/hypothermia Eyes: no blurry vision, no xerophthalmia ENT: no sore throat, no nodules palpated in throat, no dysphagia/odynophagia, no hoarseness   PE: BP (!) 146/98 Comment: 168/104 R arm with manuel cuff  Pulse 100   Ht 5' 11.5" (1.816 m)   Wt 268 lb 3.2 oz (121.7 kg)   BMI 36.88 kg/m  Wt Readings from Last 3 Encounters:  12/26/22 268 lb 3.2 oz (121.7 kg)  09/24/22 265 lb 6.4 oz (120.4 kg)  09/06/22 264 lb 6.4 oz (119.9 kg)   Constitutional:  Body mass index is 36.88 kg/m.,  not in acute distress, + Normal State of Mind Eyes: PERRLA, EOMI, no exophthalmos ENT: moist mucous membranes, no thyromegaly, no cervical lymphadenopathy  Genital exam: normal male escutcheon, no inguinal LAD, normal phallus, testes ~20 mL, no testicular masses, no penile discharge.  No gynecomastia.   CMP ( most recent) CMP     Component Value Date/Time   NA 136 02/22/2020 1735   NA 140 12/27/2019 1422   K 3.8 02/22/2020 1735   CL 102 02/22/2020 1735   CO2 23 02/22/2020 1735   GLUCOSE 101 (H) 02/22/2020 1735   BUN 22 (A) 07/05/2022 0000   CREATININE 1.0 07/05/2022 0000   CREATININE 0.80 02/22/2020 1735   CALCIUM 9.5 07/15/2022 0000   PROT 7.5 12/27/2019 1422   ALBUMIN 4.4 12/27/2019 1422   AST 57 (A) 07/05/2022 0000   ALT 96 (A) 07/05/2022 0000   ALKPHOS 63 12/27/2019 1422   BILITOT 0.6 12/27/2019 1422   GFRNONAA >60 02/22/2020 1735   GFRAA >60 02/22/2020 1735     Diabetic Labs (most recent): Lab Results  Component Value Date   HGBA1C 5.9 (H) 12/27/2019         Lab Results  Component Value Date   TSH 1.290 09/13/2022   TSH 1.980 12/27/2019   TSH 1.920 12/24/2017   FREET4 1.44 09/13/2022    Recent Results (from the past 2160 hour(s))  Testosterone, Free, Total, SHBG     Status: Abnormal   Collection Time: 11/11/22  8:15 AM  Result Value  Ref Range   Testosterone 210 (L) 264 - 916 ng/dL    Comment: Adult male reference interval is based on a population of healthy nonobese males (BMI <30) between 51 and 25 years old. [place], [place] [phone]. PMID: [id number].    Testosterone, Free 11.9 9.3 - 26.5 pg/mL   Sex Hormone Binding 4.9 (L) 16.5 - 55.9 nmol/L  Lipid panel     Status: Abnormal   Collection Time: 11/11/22  8:15 AM  Result Value Ref Range  Cholesterol, Total 174 100 - 199 mg/dL   Triglycerides 107 0 - 149 mg/dL   HDL 18 (L) >39 mg/dL   VLDL Cholesterol Cal 20 5 - 40 mg/dL   LDL Chol Calc (NIH) 136 (H) 0 - 99 mg/dL   Chol/HDL Ratio 9.7 (H) 0.0 - 5.0 ratio    Comment:                                   T. Chol/HDL Ratio                                             Men  Women                               1/2 Avg.Risk  3.4    3.3                                   Avg.Risk  5.0    4.4                                2X Avg.Risk  9.6    7.1                                3X Avg.Risk 23.4   11.0      ASSESSMENT: 1. Hypogonadism 2.  Hyperlipidemia 3.  Elevated liver enzymes-fatty liver disease 4.  Prediabetes 5.  Obesity  PLAN:  I have examined the patient, reviewed his labs and have had a long discussion with the patient regarding his existing and new testosterone results.  He showed some improvement in his total testosterone to 210.  He would benefit from slight increase in his testosterone dose.  I advised him to take 50 mg and 100 mg alternate doses every week for a total dose of 300 mg monthly.  -  I also discussed adverse effects of unnecessary testosterone replacement short-term and long-term.  Serum prolactin and gonadotropins are not remarkable. He will not need imaging of the sella/pituitary. -He will benefit from slow titration of testosterone treatment.  I discussed his options including topical versus injectable testosterone preparations.  He opted for injectable preparations.  I  discussed and initiated testosterone 50 mg IM every 7 days with plan to repeat labs and office visit in 3 months.  He presents with improved lipid panel with LDL at 136 improving from 183, responding to the statin. He is advised to continue Lipitor 10 mg p.o. nightly. In light of his metabolic dysfunction with multiple manifestations including obesity, prediabetes, severe dyslipidemia, fatty liver disease, this patient will benefit from lifestyle medicine.  - he acknowledges that there is a room for improvement in his food and drink choices. - Suggestion is made for him to avoid simple carbohydrates  from his diet including Cakes, Sweet Desserts, Ice Cream, Soda (diet and regular), Sweet Tea, Candies, Chips, Cookies, Store Bought Juices, Alcohol , Artificial Sweeteners,  Coffee Creamer, and "Sugar-free" Products, Lemonade. This will help patient to have more stable blood glucose profile and potentially avoid  unintended weight gain.  The following Lifestyle Medicine recommendations according to [facility]) were discussed and and offered to patient and he  agrees to start the journey:  A. Whole Foods, Plant-Based Nutrition comprising of fruits and vegetables, plant-based proteins, whole-grain carbohydrates was discussed in detail with the patient.   A list for source of those nutrients were also provided to the patient.  Patient will use only water or unsweetened tea for hydration. B.  The need to stay away from risky substances including alcohol, smoking; obtaining 7 to 9 hours of restorative sleep, at least 150 minutes of moderate intensity exercise weekly, the importance of healthy social connections,  and stress management techniques were discussed. C.  A full color page of  Calorie density of various food groups per pound showing examples of each food groups was provided to the patient.    He is advised to continue his atorvastatin 10 mg p.o. nightly.   He is  advised to maintain close follow-up with his PMD.  I spent  21  minutes in the care of the patient today including review of labs from Thyroid Function, CMP, and other relevant labs ; imaging/biopsy records (current and previous including abstractions from other facilities); face-to-face time discussing  his lab results and symptoms, medications doses, his options of short and long term treatment based on the latest standards of care / guidelines;   and documenting the encounter.  Ryan Carey  participated in the discussions, expressed understanding, and voiced agreement with the above plans.  All questions were answered to his satisfaction. he is encouraged to contact clinic should he have any questions or concerns prior to his return visit.   Return in about 4 months (around 04/26/2023) for Fasting Labs  in AM B4 8.  Glade Lloyd, MD [facility] [address] [place], [place] [address] Phone: [phone]  Fax: [phone]   12/26/2022, 6:21 PM  This note was partially dictated with voice recognition software. Similar sounding words can be transcribed inadequately or may not  be corrected upon review.

## 2022-12-30 ENCOUNTER — Telehealth: Payer: Self-pay | Admitting: *Deleted

## 2022-12-30 NOTE — Telephone Encounter (Signed)
Patient left a voicemail that his pharmacy told him that the Testosterone needed a PA and that they were going to send the paper work over. It was written for the patient on 12/26/2022.  Patient advised that a email would be sent to the PA team for a status

## 2022-12-31 ENCOUNTER — Other Ambulatory Visit (HOSPITAL_COMMUNITY): Payer: Self-pay

## 2022-12-31 NOTE — Telephone Encounter (Signed)
So, I see that the orders were originally written for '100mg'$ /ml and for the pt to take 0.80m to 13minto the skin once a week. Inject 50 mg and 100 mg weekly alternating.  However, the pharmacy filled '200mg'$ /ml with the directions of 0.48m40mnto the skin once a week, so that's '100mg'$ . If the pt did remember the physician's directions, and took 1ml31mhat would be the '200mg'$ . Can we confirm with the pt at all? Do we need new labs to see if the orders need to be changed and what do you want me to do the PA for? (If I'm remembering correctly I think what is available in the pharmacy is 1ml 37mls of the '200mg'$ /ml or 10ml 19ms of '1000mg'$ /10ml.43m

## 2023-01-07 ENCOUNTER — Other Ambulatory Visit (HOSPITAL_COMMUNITY): Payer: Self-pay

## 2023-01-07 ENCOUNTER — Telehealth: Payer: Self-pay | Admitting: Pharmacy Technician

## 2023-01-07 NOTE — Telephone Encounter (Signed)
Requested PA.

## 2023-01-07 NOTE — Telephone Encounter (Signed)
Pharmacy Patient Advocate Encounter   Received notification from Pt calls msgs that prior authorization for Testosterone is required/requested.   PA submitted on 01/07/23 to (ins) [employer] via [employer] or (Medicaid) confirmation # [id number] - PA Case ID: [id number] Status is pending

## 2023-01-16 NOTE — Telephone Encounter (Addendum)
Pharmacy Patient Advocate Encounter  Received notification from [employer] that the request for prior authorization for Testosterone has been denied due to: We denied your request because we did not see what we need to approve the drug you  asked for, (testosterone cypionate). We see that you have this other illness (untreated  obstructive sleep apnea [OSA]). Using this drug with this other illness is not advised. If  you do not have this other illness, we may need more information    PA Case: [id number]  Called & left a HIPAA compliant VM to see if his OSA is treated and to get more information to be able to do an appeal.

## 2023-01-27 HISTORY — PX: HAND SURGERY: SHX662

## 2023-03-07 ENCOUNTER — Other Ambulatory Visit: Payer: Self-pay | Admitting: "Endocrinology

## 2023-03-11 ENCOUNTER — Telehealth: Payer: Self-pay

## 2023-03-11 NOTE — Telephone Encounter (Signed)
Patient Advocate Encounter   Received notification from [facility] that prior authorization is required for Testosterone Cypionate 100MG /ML  Submitted: 03/11/23 Key [id number]  Status is pending

## 2023-03-20 NOTE — Telephone Encounter (Signed)
A new pa has been submitted. See more recent telephone note from 03/11/23

## 2023-03-31 ENCOUNTER — Other Ambulatory Visit (HOSPITAL_COMMUNITY): Payer: Self-pay

## 2023-03-31 NOTE — Telephone Encounter (Signed)
PA was cancelled due to prior denial

## 2023-04-29 LAB — LIPID PANEL
Chol/HDL Ratio: 3.5 ratio (ref 0.0–5.0)
Cholesterol, Total: 120 mg/dL (ref 100–199)
HDL: 34 mg/dL — ABNORMAL LOW (ref >39)
LDL Chol Calc (NIH): 56 mg/dL (ref 0–99)
Triglycerides: 181 mg/dL — ABNORMAL HIGH (ref 0–149)
VLDL Cholesterol Cal: 30 mg/dL (ref 5–40)

## 2023-04-29 LAB — TESTOSTERONE, FREE, TOTAL, SHBG
Sex Hormone Binding: 13.7 nmol/L — ABNORMAL LOW (ref 16.5–55.9)
Testosterone, Free: 3.5 pg/mL — ABNORMAL LOW (ref 9.3–26.5)
Testosterone: 96 ng/dL — ABNORMAL LOW (ref 264–916)

## 2023-05-06 ENCOUNTER — Ambulatory Visit: Payer: BC Managed Care – PPO | Admitting: "Endocrinology

## 2023-05-06 ENCOUNTER — Encounter: Payer: Self-pay | Admitting: "Endocrinology

## 2023-05-06 VITALS — BP 150/104 | HR 96 | Ht 71.5 in | Wt 268.2 lb

## 2023-05-06 DIAGNOSIS — E291 Testicular hypofunction: Secondary | ICD-10-CM

## 2023-05-06 DIAGNOSIS — Z6836 Body mass index (BMI) 36.0-36.9, adult: Secondary | ICD-10-CM

## 2023-05-06 DIAGNOSIS — R7303 Prediabetes: Secondary | ICD-10-CM | POA: Diagnosis not present

## 2023-05-06 DIAGNOSIS — E782 Mixed hyperlipidemia: Secondary | ICD-10-CM | POA: Diagnosis not present

## 2023-05-06 DIAGNOSIS — I10 Essential (primary) hypertension: Secondary | ICD-10-CM | POA: Diagnosis not present

## 2023-05-06 HISTORY — DX: Essential (primary) hypertension: I10

## 2023-05-06 MED ORDER — LOSARTAN POTASSIUM-HCTZ 50-12.5 MG PO TABS: 1.0000 | ORAL_TABLET | Freq: Every day | ORAL | 1 refills | Status: AC

## 2023-05-06 MED ORDER — TESTOSTERONE CYPIONATE 100 MG/ML IM SOLN: INTRAMUSCULAR | 0 refills | Status: DC

## 2023-05-06 NOTE — Progress Notes (Signed)
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   Chronic headaches    Enuresis    Essential hypertension, benign 05/06/2023   Past Surgical History:  Procedure Laterality Date   CHOLECYSTECTOMY  2021   HAND SURGERY Right 01/2023   Social History   Socioeconomic History   Marital status: Single    Spouse name: Not on file   Number of children: Not on file   Years of education: Not on file   Highest education level: Not on file  Occupational History   Not on file  Tobacco Use   Smoking status: Never   Smokeless tobacco: Former    Types: Snuff    Quit date: 11/28/2018  Vaping Use   Vaping Use: Never used  Substance and Sexual Activity   Alcohol use: Not Currently    Comment: occ   Drug use: No   Sexual activity: Not on file  Other Topics Concern   Not on file  Social History Narrative   Not on file   Social Determinants of Health   Financial Resource Strain: Not on file  Food Insecurity: Not on file  Transportation Needs: Not on file  Physical Activity: Not on file  Stress: Not on file  Social Connections: Not on file   Outpatient Encounter Medications as of 05/06/2023  Medication Sig   losartan-hydrochlorothiazide (HYZAAR) 50-12.5 MG tablet Take 1 tablet by mouth daily with breakfast.   amphetamine-dextroamphetamine (ADDERALL) 20 MG tablet Take 20 mg by mouth 2 (two) times daily.   atorvastatin (LIPITOR) 10 MG tablet Take 10 mg by mouth daily.   meloxicam (MOBIC) 15 MG tablet Take 15 mg by mouth daily as needed.   sertraline (ZOLOFT) 50 MG tablet Take 1 tablet (50 mg total) by mouth daily.   SYRINGE-NEEDLE, DISP, 3 ML 21G X 1-1/2" 3 ML MISC Use to inject testosterone every week   testosterone cypionate (DEPOTESTOTERONE CYPIONATE) 100 MG/ML  injection Inject  100 mg  (1ml) every 10 days   [DISCONTINUED] testosterone cypionate (DEPOTESTOTERONE CYPIONATE) 100 MG/ML injection INJECT 0.5-1 MLS (50-100 MG TOTAL) INTO THE MUSCLE EVERY 7 (SEVEN) DAYS. INJECT 50 MG AND 100 MG WEEKLY ALTERNATING   No facility-administered encounter medications on file as of 05/06/2023.   ALLERGIES: Allergies  Allergen Reactions   Codeine Other (See Comments)     HYDRO- Codone caused the  chest pain    VACCINATION STATUS: Immunization History  Administered Date(s) Administered   Meningococcal Conjugate 12/22/2008   Tdap 12/22/2008, 04/06/2019    HPI: Ryan Carey is a 27 y.o.-year-old man, being seen in follow-up after he was seen in consultation for evaluation and management of low testosterone requested by by his   provider  Donetta Potts, MD.  He was found to have hypogonadism with total testostetone of 42 on July 05, 2022. His repeat previsit labs show total testosterone of 53.  He was started on on manage a total testosterone of 300 mg during his last visit.  He returns with total testosterone dropping to 96 from 210.  He reports consistency and  compliance to his medications.    -He reports modest improvement in his symptoms and libido.  He denies  trauma to testes,  chemotherapy,  testicular irradiation,  nor genitourinary surgery. No h/o cryptorchidism. He denies history of  mumps orchitis/ history of  autoimmune disorders.  He grew and went through puberty like his peers. No personal history of  infertility - has 1 biological children he is sure about , 1 more he is not sure about. No incomplete/delayed sexual development.     No breast discomfort/gynecomastia. No abnormal sense of smell (only allergies). No hot flushes. No vision problems.  No report of changing headaches. No FH of hypogonadism/infertility . No Family history of hemochromatosis or pituitary tumors. No recent rapid weight change. No chronic diseases. No  chronic pain. Not on opiates, does not take steroids.  No more than 2 drinks a day of alcohol at a time, and this is rarely. No anabolic steroids use. No herbal medicines. Not on antidepressants. He has medical history significant for prediabetes, severe dyslipidemia, obesity.  He is on atorvastatin 10 mg p.o. nightly.  He returns with improved cholesterol profile.  Was found to have severe hypertension during this visit as well as during his prior visits x 2.    ROS: Constitutional: no weight gain/loss, no fatigue, no subjective hyperthermia/hypothermia Eyes: no blurry vision, no xerophthalmia ENT: no sore throat, no nodules palpated in throat, no dysphagia/odynophagia, no hoarseness   PE: BP (!) 150/104 Comment: R arm with manuel cuff. Dr.Quentavious Rittenhouse made aware  Pulse 96   Ht 5' 11.5" (1.816 m)   Wt 268 lb 3.2 oz (121.7 kg)   BMI 36.88 kg/m  Wt Readings from Last 3 Encounters:  05/06/23 268 lb 3.2 oz (121.7 kg)  12/26/22 268 lb 3.2 oz (121.7 kg)  09/24/22 265 lb 6.4 oz (120.4 kg)   Constitutional:  Body mass index is 36.88 kg/m.,  not in acute distress, + Normal State of Mind Eyes: PERRLA, EOMI, no exophthalmos ENT: moist mucous membranes, no thyromegaly, no cervical lymphadenopathy  Genital exam: normal male escutcheon, no inguinal LAD, normal phallus, testes ~20 mL, no testicular masses, no penile discharge.  No gynecomastia.   CMP ( most recent) CMP     Component Value Date/Time   NA 136 02/22/2020 1735   NA 140 12/27/2019 1422   K 3.8 02/22/2020 1735   CL 102 02/22/2020 1735   CO2 23 02/22/2020 1735   GLUCOSE 101 (H) 02/22/2020 1735   BUN 22 (A) 07/05/2022 0000   CREATININE 1.0 07/05/2022 0000   CREATININE 0.80 02/22/2020 1735   CALCIUM 9.5 07/15/2022 0000   PROT 7.5 12/27/2019 1422   ALBUMIN 4.4 12/27/2019 1422   AST 57 (A) 07/05/2022 0000   ALT 96 (A) 07/05/2022 0000   ALKPHOS 63 12/27/2019 1422   BILITOT 0.6 12/27/2019 1422   GFRNONAA >60 02/22/2020 1735    GFRAA >60 02/22/2020 1735     Diabetic Labs (most recent): Lab Results  Component Value Date   HGBA1C 5.9 (H) 12/27/2019         Lab Results  Component Value Date   TSH 1.290 09/13/2022   TSH 1.980 12/27/2019   TSH 1.920 12/24/2017   FREET4 1.44 09/13/2022    Recent Results (from the past 2160 hour(s))  Testosterone, Free, Total, SHBG     Status: Abnormal   Collection Time: 04/25/23  8:19 AM  Result Value Ref Range   Testosterone 96 (L) 264 - 916 ng/dL  Comment: Adult male reference interval is based on a population of healthy nonobese males (BMI <30) between 11 and 32 years old. Travison, et.al. JCEM [phone]. PMID: [id number].    Testosterone, Free 3.5 (L) 9.3 - 26.5 pg/mL   Sex Hormone Binding 13.7 (L) 16.5 - 55.9 nmol/L  Lipid panel     Status: Abnormal   Collection Time: 04/25/23  8:19 AM  Result Value Ref Range   Cholesterol, Total 120 100 - 199 mg/dL   Triglycerides 469 (H) 0 - 149 mg/dL   HDL 34 (L) >62 mg/dL   VLDL Cholesterol Cal 30 5 - 40 mg/dL   LDL Chol Calc (NIH) 56 0 - 99 mg/dL   Chol/HDL Ratio 3.5 0.0 - 5.0 ratio    Comment:                                   T. Chol/HDL Ratio                                             Men  Women                               1/2 Avg.Risk  3.4    3.3                                   Avg.Risk  5.0    4.4                                2X Avg.Risk  9.6    7.1                                3X Avg.Risk 23.4   11.0      ASSESSMENT: 1. Hypogonadism 2.  Hyperlipidemia 3.  Elevated liver enzymes-fatty liver disease 4.  Prediabetes 5.  Obesity 6.  Hypertension  PLAN:  I have examined the patient, reviewed his new and existing labs and have had a long discussion with the patient regarding his hypogonadism and his metabolic syndrome.    I discussed and switched his testosterone to 100 mg IM every 10 days, emphasizing, compliance  and consistency,  for a total dose of 300 mg monthly.  -  I also discussed  adverse effects of unnecessary testosterone replacement short-term and long-term.  Serum prolactin and gonadotropins are not remarkable. He will not need imaging of the sella/pituitary. -He will benefit from slow titration of testosterone treatment.  Regarding his hyperlipidemia, he is returning with LDL improving to 56 from 136.  He is advised to continue Lipitor 10 mg p.o. nightly.  Side effects and precautions discussed with him. -He was found to have persistent, stage 1 hypertension.  He has concurrent comorbidities including morbid obesity, dyslipidemia, prediabetes, he would benefit from early initiation of antihypertensive medication.  I discussed and added losartan/HCT 50/12.5 mg p.o. daily at breakfast.   In light of his metabolic dysfunction with multiple manifestations  fatty liver disease, this patient remains a good candidate for lifestyle medicine.     -  he acknowledges that there is a room for improvement in his food and drink choices. - Suggestion is made for him to avoid simple carbohydrates  from his diet including Cakes, Sweet Desserts, Ice Cream, Soda (diet and regular), Sweet Tea, Candies, Chips, Cookies, Store Bought Juices, Alcohol , Artificial Sweeteners,  Coffee Creamer, and "Sugar-free" Products, Lemonade. This will help patient to have more stable blood glucose profile and potentially avoid unintended weight gain.  The following Lifestyle Medicine recommendations according to American College of Lifestyle Medicine  [facility]) were discussed and and offered to patient and he  agrees to start the journey:  A. Whole Foods, Plant-Based Nutrition comprising of fruits and vegetables, plant-based proteins, whole-grain carbohydrates was discussed in detail with the patient.   A list for source of those nutrients were also provided to the patient.  Patient will use only water or unsweetened tea for hydration. B.  The need to stay away from risky substances including alcohol, smoking;  obtaining 7 to 9 hours of restorative sleep, at least 150 minutes of moderate intensity exercise weekly, the importance of healthy social connections,  and stress management techniques were discussed. C.  A full color page of  Calorie density of various food groups per pound showing examples of each food groups was provided to the patient.   He is advised to maintain close follow-up with his PMD.   I spent  41  minutes in the care of the patient today including review of labs from Thyroid Function, CMP, and other relevant labs ; imaging/biopsy records (current and previous including abstractions from other facilities); face-to-face time discussing  his lab results and symptoms, medications doses, his options of short and long term treatment based on the latest standards of care / guidelines;   and documenting the encounter. Risk reduction counseling performed per USPSTF guidelines to reduce obesity and cardiovascular risk factors.    Ryan Carey  participated in the discussions, expressed understanding, and voiced agreement with the above plans.  All questions were answered to his satisfaction. he is encouraged to contact clinic should he have any questions or concerns prior to his return visit.   Return for Fasting Labs  in AM B4 8, A1c -NV.  Marquis Lunch, MD [facility] [address] [place], [place] [address] Phone: [phone]  Fax: [phone]   05/06/2023, 5:13 PM  This note was partially dictated with voice recognition software. Similar sounding words can be transcribed inadequately or may not  be corrected upon review.

## 2023-05-06 NOTE — Patient Instructions (Signed)

## 2023-05-26 ENCOUNTER — Other Ambulatory Visit: Payer: Self-pay | Admitting: "Endocrinology

## 2023-07-04 ENCOUNTER — Encounter: Payer: Self-pay | Admitting: Pulmonary Disease

## 2023-07-04 ENCOUNTER — Ambulatory Visit (INDEPENDENT_AMBULATORY_CARE_PROVIDER_SITE_OTHER): Payer: BC Managed Care – PPO | Admitting: Pulmonary Disease

## 2023-07-04 VITALS — BP 152/84 | HR 91 | Ht 71.5 in | Wt 267.2 lb

## 2023-07-04 DIAGNOSIS — I10 Essential (primary) hypertension: Secondary | ICD-10-CM

## 2023-07-04 DIAGNOSIS — Z6835 Body mass index (BMI) 35.0-35.9, adult: Secondary | ICD-10-CM | POA: Diagnosis not present

## 2023-07-04 DIAGNOSIS — G4733 Obstructive sleep apnea (adult) (pediatric): Secondary | ICD-10-CM | POA: Diagnosis not present

## 2023-07-04 NOTE — Assessment & Plan Note (Signed)
He had severe OSA with severe desaturation.  I reviewed CPAP report on his phone app.  He has been obtaining supplies on his own and does not have relationship with any DME. He has an AirSense 10 machine and a Radiographer, therapeutic and Paykel fullface mask.  His compliance is excellent without a single missed night more than 7 hours per night.  He has minimal leak and no residual events He is already on Adderall which should also help with daytime somnolence.  The reason for his fatigue may be insufficient sleep.  I have asked him to trial melatonin which will hopefully shorten his sleep latency and decrease his need for catch-up sleep on weekends

## 2023-07-04 NOTE — Assessment & Plan Note (Signed)
Slight high today. He will follow-up with PCP

## 2023-07-04 NOTE — Progress Notes (Signed)
Subjective:    Patient ID: Ryan Carey, male    DOB: 1996-03-07, 27 y.o.   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  He was placed on auto CPAP 8 to 15 cm with good improvement in daytime somnolence and fatigue.  He settled down with a fullface mask.  CPAP initially seemed to help significantly but over the last 6 months he complains of chronic fatigue, difficulty waking up and difficulty falling asleep.  He feels drained out during the day and he wonders if CPAP stopped working. Epworth Sleepiness Scale is 8.  He has lost 75 pounds to his current weight of 267 pounds. He has been started on testosterone injections by endocrine for at least a year he has been diagnosed with new onset hypertension.  He continues on Adderall twice daily. Bedtime is around 10 PM, sleep latency can be 1 to 2 hours, he tosses and turns through the night and at least has 2 nocturnal awakenings and is out of bed by 4:30 AM feeling tired. On weekends he will stay in bed around 10 AM and still remains tired. 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   Chronic headaches    Enuresis    Essential hypertension, benign 05/06/2023   Past Surgical History:  Procedure Laterality Date   CHOLECYSTECTOMY  2021   HAND SURGERY Right 01/2023    Allergies  Allergen Reactions   Codeine Other (See Comments)     HYDRO- Codone caused the  chest pain    Social History   Socioeconomic History   Marital status: Single    Spouse name: Not on file   Number of children: Not on file   Years of education: Not on file   Highest education level: Not on file   Occupational History   Not on file  Tobacco Use   Smoking status: Never   Smokeless tobacco: Former    Types: Snuff    Quit date: 11/28/2018  Vaping Use   Vaping status: Never Used  Substance and Sexual Activity   Alcohol use: Not Currently    Comment: occ   Drug use: No   Sexual activity: Not on file  Other Topics Concern   Not on file  Social History Narrative   Not on file   Social Determinants of Health   Financial Resource Strain: Not on file  Food Insecurity: Not on file  Transportation Needs: Not on file  Physical Activity: Not on file  Stress: Not on file  Social Connections: Not on file  Intimate Partner Violence: Not on file    Family History  Problem Relation Age of Onset   Gallbladder disease Mother    Gallstones Maternal Grandmother      Review of Systems Constitutional: negative for anorexia, fevers and sweats  Eyes: negative for irritation, redness and visual disturbance  Ears, nose, mouth, throat, and face: negative for earaches, epistaxis, nasal congestion and sore throat  Respiratory: negative for cough, dyspnea on exertion, sputum and wheezing  Cardiovascular: negative for chest pain, dyspnea, lower extremity edema, orthopnea, palpitations and syncope  Gastrointestinal: negative for abdominal pain, constipation,  diarrhea, melena, nausea and vomiting  Genitourinary:negative for dysuria, frequency and hematuria  Hematologic/lymphatic: negative for bleeding, easy bruising and lymphadenopathy  Musculoskeletal:negative for arthralgias, muscle weakness and stiff joints  Neurological: negative for coordination problems, gait problems, headaches and weakness  Endocrine: negative for diabetic symptoms including polydipsia, polyuria and weight loss     Objective:   Physical Exam  Gen. Pleasant, obese, in no distress, normal affect ENT - no pallor,icterus, no post nasal drip, class 2-3 airway Neck: No JVD, no thyromegaly, no carotid bruits Lungs: no  use of accessory muscles, no dullness to percussion, decreased without rales or rhonchi  Cardiovascular: Rhythm regular, heart sounds  normal, no murmurs or gallops, no peripheral edema Abdomen: soft and non-tender, no hepatosplenomegaly, BS normal. Musculoskeletal: No deformities, no cyanosis or clubbing Neuro:  alert, non focal, no tremors       Assessment & Plan:

## 2023-07-04 NOTE — Patient Instructions (Signed)
CPAP seems to be working ok  Economist were discussed  - light exercise -avoid caffeinated beverages - no more than 20 mins staying awake in bed, if not asleep, get out of bed & reading or light music - No TV or computer games at bedtime.  Ok to trial melatonin 5 mg @ 8pm to help you get to sleep  X we can check oxygen level - ONO on CPAP/RA

## 2023-08-01 LAB — COMPREHENSIVE METABOLIC PANEL
ALT: 58 [IU]/L — ABNORMAL HIGH (ref 0–44)
AST: 35 [IU]/L (ref 0–40)
Albumin: 4.6 g/dL (ref 4.3–5.2)
Alkaline Phosphatase: 56 [IU]/L (ref 44–121)
BUN/Creatinine Ratio: 15 (ref 9–20)
BUN: 13 mg/dL (ref 6–20)
Bilirubin Total: 1.3 mg/dL — ABNORMAL HIGH (ref 0.0–1.2)
CO2: 22 mmol/L (ref 20–29)
Calcium: 9.4 mg/dL (ref 8.7–10.2)
Chloride: 101 mmol/L (ref 96–106)
Creatinine, Ser: 0.89 mg/dL (ref 0.76–1.27)
Globulin, Total: 2.8 g/dL (ref 1.5–4.5)
Glucose: 93 mg/dL (ref 70–99)
Potassium: 4.7 mmol/L (ref 3.5–5.2)
Sodium: 141 mmol/L (ref 134–144)
Total Protein: 7.4 g/dL (ref 6.0–8.5)
eGFR: 119 mL/min/{1.73_m2} (ref >59)

## 2023-08-01 LAB — LIPID PANEL
Chol/HDL Ratio: 5.3 {ratio} — ABNORMAL HIGH (ref 0.0–5.0)
Cholesterol, Total: 174 mg/dL (ref 100–199)
HDL: 33 mg/dL — ABNORMAL LOW (ref >39)
LDL Chol Calc (NIH): 103 mg/dL — ABNORMAL HIGH (ref 0–99)
Triglycerides: 219 mg/dL — ABNORMAL HIGH (ref 0–149)
VLDL Cholesterol Cal: 38 mg/dL (ref 5–40)

## 2023-08-01 LAB — TESTOSTERONE, FREE, TOTAL, SHBG
Sex Hormone Binding: 14.5 nmol/L — ABNORMAL LOW (ref 16.5–55.9)
Testosterone, Free: 19.4 pg/mL (ref 9.3–26.5)
Testosterone: 487 ng/dL (ref 264–916)

## 2023-08-06 ENCOUNTER — Ambulatory Visit: Payer: BC Managed Care – PPO | Admitting: "Endocrinology

## 2023-08-06 ENCOUNTER — Encounter: Payer: Self-pay | Admitting: "Endocrinology

## 2023-08-06 VITALS — BP 132/88 | HR 68 | Ht 71.5 in | Wt 274.0 lb

## 2023-08-06 DIAGNOSIS — E782 Mixed hyperlipidemia: Secondary | ICD-10-CM

## 2023-08-06 DIAGNOSIS — I10 Essential (primary) hypertension: Secondary | ICD-10-CM

## 2023-08-06 DIAGNOSIS — E291 Testicular hypofunction: Secondary | ICD-10-CM

## 2023-08-06 DIAGNOSIS — R7303 Prediabetes: Secondary | ICD-10-CM | POA: Diagnosis not present

## 2023-08-06 DIAGNOSIS — Z6836 Body mass index (BMI) 36.0-36.9, adult: Secondary | ICD-10-CM

## 2023-08-06 DIAGNOSIS — E66812 Obesity, class 2: Secondary | ICD-10-CM

## 2023-08-06 LAB — POCT GLYCOSYLATED HEMOGLOBIN (HGB A1C): HbA1c, POC (controlled diabetic range): 5.6 % (ref 0.0–7.0)

## 2023-08-06 MED ORDER — TESTOSTERONE CYPIONATE 100 MG/ML IM SOLN: INTRAMUSCULAR | 1 refills | Status: DC

## 2023-08-06 MED ORDER — ATORVASTATIN CALCIUM 20 MG PO TABS: 20.0000 mg | ORAL_TABLET | Freq: Every day | ORAL | 1 refills | Status: DC

## 2023-08-06 NOTE — Progress Notes (Unsigned)
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   Chronic headaches    Enuresis    Essential hypertension, benign 05/06/2023   Past Surgical History:  Procedure Laterality Date   CHOLECYSTECTOMY  2021   HAND SURGERY Right 01/2023   Social History   Socioeconomic History   Marital status: Single    Spouse name: Not on file   Number of children: Not on file   Years of education: Not on file   Highest education level: Not on file  Occupational History   Not on file  Tobacco Use   Smoking status: Never   Smokeless tobacco: Former    Types: Snuff    Quit date: 11/28/2018  Vaping Use   Vaping status: Never Used  Substance and Sexual Activity   Alcohol use: Not Currently    Comment: occ   Drug use: No   Sexual activity: Not on file  Other Topics Concern   Not on file  Social History Narrative   Not on file   Social Determinants of Health   Financial Resource Strain: Not on file  Food Insecurity: Not on file  Transportation Needs: Not on file  Physical Activity: Not on file  Stress: Not on file  Social Connections: Not on file   Outpatient Encounter Medications as of 08/06/2023  Medication Sig   amphetamine-dextroamphetamine (ADDERALL) 20 MG tablet Take 20 mg by mouth 2 (two) times daily. Pt said that he is taking 30 mg twice a day   atorvastatin (LIPITOR) 20 MG tablet Take 1 tablet (20 mg total) by mouth daily.   cloNIDine (CATAPRES) 0.1 MG tablet Take 0.1 mg by mouth daily.   losartan-hydrochlorothiazide (HYZAAR) 50-12.5 MG tablet Take 1 tablet by mouth daily with breakfast.   meloxicam (MOBIC) 15 MG tablet Take 15 mg by mouth daily as needed.   sertraline (ZOLOFT) 50 MG tablet Take 1 tablet (50 mg total) by mouth daily.   SYRINGE-NEEDLE, DISP, 3 ML  21G X 1-1/2" 3 ML MISC Use to inject testosterone every week   testosterone cypionate (DEPOTESTOTERONE CYPIONATE) 100 MG/ML injection INJECT 1 MLS (100 MG) INTO THE MUSCLE EVERY 10 DAYS.   [DISCONTINUED] atorvastatin (LIPITOR) 10 MG tablet Take 10 mg by mouth daily.   [DISCONTINUED] testosterone cypionate (DEPOTESTOTERONE CYPIONATE) 100 MG/ML injection INJECT 1 MLS (100 MG) INTO THE MUSCLE EVERY 10 DAYS.   No facility-administered encounter medications on file as of 08/06/2023.   ALLERGIES: Allergies  Allergen Reactions   Codeine Other (See Comments)     HYDRO- Codone caused the  chest pain    VACCINATION STATUS: Immunization History  Administered Date(s) Administered   Meningococcal Conjugate 12/22/2008   Tdap 12/22/2008, 04/06/2019    HPI: Ryan Carey is a 27 y.o.-year-old man, being seen in follow-up after he was seen in consultation for evaluation and management of low testosterone requested by by his   provider  Donetta Potts, MD.  He was found to have hypogonadism with total testostetone of 42 on July 05, 2022. His repeat previsit labs show total testosterone of 53.  He was started on on manage a total testosterone of 300 mg during his last visit.  He returns with total testosterone dropping to 96 from 210.  He reports consistency and compliance to his medications.    -He reports modest improvement in his symptoms and libido.  He denies  trauma to testes,  chemotherapy,  testicular irradiation,  nor genitourinary surgery. No h/o cryptorchidism. He denies history of  mumps orchitis/ history of  autoimmune disorders.  He grew and went through puberty like his peers. No personal history of  infertility - has 1 biological children he is sure about , 1 more he is not sure about. No incomplete/delayed sexual development.     No breast discomfort/gynecomastia. No abnormal sense of smell (only allergies). No hot flushes. No vision problems.  No report of changing  headaches. No FH of hypogonadism/infertility . No Family history of hemochromatosis or pituitary tumors. No recent rapid weight change. No chronic diseases. No chronic pain. Not on opiates, does not take steroids.  No more than 2 drinks a day of alcohol at a time, and this is rarely. No anabolic steroids use. No herbal medicines. Not on antidepressants. He has medical history significant for prediabetes, severe dyslipidemia, obesity.  He is on atorvastatin 10 mg p.o. nightly.  He returns with improved cholesterol profile.  Was found to have severe hypertension during this visit as well as during his prior visits x 2.    ROS: Constitutional: no weight gain/loss, no fatigue, no subjective hyperthermia/hypothermia Eyes: no blurry vision, no xerophthalmia ENT: no sore throat, no nodules palpated in throat, no dysphagia/odynophagia, no hoarseness   PE: BP 132/88   Pulse 68   Ht 5' 11.5" (1.816 m)   Wt 274 lb (124.3 kg)   BMI 37.68 kg/m  Wt Readings from Last 3 Encounters:  08/06/23 274 lb (124.3 kg)  07/04/23 267 lb 3.2 oz (121.2 kg)  05/06/23 268 lb 3.2 oz (121.7 kg)   Constitutional:  Body mass index is 37.68 kg/m.,  not in acute distress, + Normal State of Mind Eyes: PERRLA, EOMI, no exophthalmos ENT: moist mucous membranes, no thyromegaly, no cervical lymphadenopathy  Genital exam: normal male escutcheon, no inguinal LAD, normal phallus, testes ~20 mL, no testicular masses, no penile discharge.  No gynecomastia.   CMP ( most recent) CMP     Component Value Date/Time   NA 141 07/28/2023 0823   K 4.7 07/28/2023 0823   CL 101 07/28/2023 0823   CO2 22 07/28/2023 0823   GLUCOSE 93 07/28/2023 0823   GLUCOSE 101 (H) 02/22/2020 1735   BUN 13 07/28/2023 0823   CREATININE 0.89 07/28/2023 0823   CALCIUM 9.4 07/28/2023 0823   PROT 7.4 07/28/2023 0823   ALBUMIN 4.6 07/28/2023 0823   AST 35 07/28/2023 0823   ALT 58 (H) 07/28/2023 0823   ALKPHOS 56 07/28/2023 0823   BILITOT  1.3 (H) 07/28/2023 0823   GFRNONAA >60 02/22/2020 1735   GFRAA >60 02/22/2020 1735     Diabetic Labs (most recent): Lab Results  Component Value Date   HGBA1C 5.6 08/06/2023   HGBA1C 5.9 (H) 12/27/2019         Lab Results  Component Value Date   TSH 1.290 09/13/2022   TSH 1.980 12/27/2019   TSH 1.920 12/24/2017   FREET4 1.44 09/13/2022    Recent Results (from the past 2160 hour(s))  Comprehensive metabolic panel     Status: Abnormal   Collection Time: 07/28/23  8:23 AM  Result Value Ref Range   Glucose 93 70 - 99 mg/dL   BUN 13 6 - 20 mg/dL   Creatinine, Ser 5.62 0.76 - 1.27 mg/dL   eGFR 130 >86 VH/QIO/9.62   BUN/Creatinine Ratio 15 9 - 20   Sodium 141 134 - 144 mmol/L   Potassium 4.7 3.5 - 5.2 mmol/L   Chloride 101 96 - 106 mmol/L   CO2 22 20 - 29 mmol/L   Calcium 9.4 8.7 - 10.2 mg/dL   Total Protein 7.4 6.0 - 8.5 g/dL   Albumin 4.6 4.3 - 5.2 g/dL   Globulin, Total 2.8 1.5 - 4.5 g/dL   Bilirubin Total 1.3 (H) 0.0 - 1.2 mg/dL   Alkaline Phosphatase 56 44 - 121 IU/L   AST 35 0 - 40 IU/L   ALT 58 (H) 0 - 44 IU/L  Lipid panel     Status: Abnormal   Collection Time: 07/28/23  8:23 AM  Result Value Ref Range   Cholesterol, Total 174 100 - 199 mg/dL   Triglycerides 952 (H) 0 - 149 mg/dL   HDL 33 (L) >84 mg/dL   VLDL Cholesterol Cal 38 5 - 40 mg/dL   LDL Chol Calc (NIH) 132 (H) 0 - 99 mg/dL   Chol/HDL Ratio 5.3 (H) 0.0 - 5.0 ratio    Comment:                                   T. Chol/HDL Ratio                                             Men  Women                               1/2 Avg.Risk  3.4    3.3                                   Avg.Risk  5.0    4.4                                2X Avg.Risk  9.6    7.1                                3X Avg.Risk 23.4   11.0   Testosterone, Free, Total, SHBG     Status: Abnormal   Collection Time: 07/28/23  8:23 AM  Result Value Ref Range   Testosterone 487 264 - 916 ng/dL    Comment: Adult male reference interval is  based on a population of healthy nonobese males (BMI <30) between 87 and 42 years old. Travison, et.al. JCEM [phone]. PMID: [id number].    Testosterone, Free 19.4 9.3 - 26.5 pg/mL   Sex Hormone Binding 14.5 (L) 16.5 - 55.9 nmol/L  HgB A1c     Status: None   Collection Time: 08/06/23  3:50 PM  Result Value Ref Range   Hemoglobin A1C     HbA1c POC (<> result, manual entry)     HbA1c, POC (prediabetic range)  HbA1c, POC (controlled diabetic range) 5.6 0.0 - 7.0 %     ASSESSMENT: 1. Hypogonadism 2.  Hyperlipidemia 3.  Elevated liver enzymes-fatty liver disease 4.  Prediabetes 5.  Obesity 6.  Hypertension  PLAN:  I have examined the patient, reviewed his new and existing labs and have had a long discussion with the patient regarding his hypogonadism and his metabolic syndrome.    I discussed and switched his testosterone to 100 mg IM every 10 days, emphasizing, compliance  and consistency,  for a total dose of 300 mg monthly.  -  I also discussed adverse effects of unnecessary testosterone replacement short-term and long-term.  Serum prolactin and gonadotropins are not remarkable. He will not need imaging of the sella/pituitary. -He will benefit from slow titration of testosterone treatment.  Regarding his hyperlipidemia, he is returning with LDL improving to 56 from 136.  He is advised to continue Lipitor 10 mg p.o. nightly.  Side effects and precautions discussed with him. -He was found to have persistent, stage 1 hypertension.  He has concurrent comorbidities including morbid obesity, dyslipidemia, prediabetes, he would benefit from early initiation of antihypertensive medication.  I discussed and added losartan/HCT 50/12.5 mg p.o. daily at breakfast.   In light of his metabolic dysfunction with multiple manifestations  fatty liver disease, this patient remains a good candidate for lifestyle medicine.     - he acknowledges that there is a room for improvement in his  food and drink choices. - Suggestion is made for him to avoid simple carbohydrates  from his diet including Cakes, Sweet Desserts, Ice Cream, Soda (diet and regular), Sweet Tea, Candies, Chips, Cookies, Store Bought Juices, Alcohol , Artificial Sweeteners,  Coffee Creamer, and "Sugar-free" Products, Lemonade. This will help patient to have more stable blood glucose profile and potentially avoid unintended weight gain.  The following Lifestyle Medicine recommendations according to American College of Lifestyle Medicine  [facility]) were discussed and and offered to patient and he  agrees to start the journey:  A. Whole Foods, Plant-Based Nutrition comprising of fruits and vegetables, plant-based proteins, whole-grain carbohydrates was discussed in detail with the patient.   A list for source of those nutrients were also provided to the patient.  Patient will use only water or unsweetened tea for hydration. B.  The need to stay away from risky substances including alcohol, smoking; obtaining 7 to 9 hours of restorative sleep, at least 150 minutes of moderate intensity exercise weekly, the importance of healthy social connections,  and stress management techniques were discussed. C.  A full color page of  Calorie density of various food groups per pound showing examples of each food groups was provided to the patient.   He is advised to maintain close follow-up with his PMD.   I spent  41  minutes in the care of the patient today including review of labs from Thyroid Function, CMP, and other relevant labs ; imaging/biopsy records (current and previous including abstractions from other facilities); face-to-face time discussing  his lab results and symptoms, medications doses, his options of short and long term treatment based on the latest standards of care / guidelines;   and documenting the encounter. Risk reduction counseling performed per USPSTF guidelines to reduce obesity and cardiovascular risk factors.     Ryan Carey  participated in the discussions, expressed understanding, and voiced agreement with the above plans.  All questions were answered to his satisfaction. he is encouraged to contact clinic should he have any  questions or concerns prior to his return visit.   Return in about 4 months (around 12/07/2023) for Fasting Labs  in AM B4 8.  Marquis Lunch, MD [facility] [address] [place], [place] [address] Phone: [phone]  Fax: [phone]   08/06/2023, 6:46 PM  This note was partially dictated with voice recognition software. Similar sounding words can be transcribed inadequately or may not  be corrected upon review.

## 2023-12-08 ENCOUNTER — Ambulatory Visit: Payer: BC Managed Care – PPO | Admitting: "Endocrinology

## 2024-01-23 ENCOUNTER — Ambulatory Visit: Payer: BC Managed Care – PPO | Admitting: "Endocrinology

## 2024-03-25 ENCOUNTER — Telehealth: Payer: Self-pay | Admitting: Pulmonary Disease

## 2024-03-25 NOTE — Telephone Encounter (Signed)
 Copied from CRM [id number]. Topic: Clinical - Lab/Test Results >> Mar 25, 2024  1:27 PM Crist Dominion wrote: Reason for CRM: [facility] is requesting patients at home sleep study test results to be faxed to them at [phone]

## 2024-03-25 NOTE — Telephone Encounter (Signed)
 Faxed

## 2024-05-07 ENCOUNTER — Ambulatory Visit: Admitting: Urology

## 2024-05-07 DIAGNOSIS — E291 Testicular hypofunction: Secondary | ICD-10-CM

## 2024-06-21 ENCOUNTER — Ambulatory Visit (INDEPENDENT_AMBULATORY_CARE_PROVIDER_SITE_OTHER): Admitting: Urology

## 2024-06-21 ENCOUNTER — Encounter: Payer: Self-pay | Admitting: Urology

## 2024-06-21 VITALS — BP 155/99 | HR 76

## 2024-06-21 DIAGNOSIS — E291 Testicular hypofunction: Secondary | ICD-10-CM

## 2024-06-21 NOTE — Progress Notes (Signed)
 06/21/2024 9:17 AM   Ryan Carey 02-04-1996 [id number]  Referring provider: Dow Longs, PA-C [address] [place],  [place] [address]  Low testosterone    HPI: Ryan Carey is a 28yo here for evaluation of hypogonadism. He was previously on IM testosterone  for 1 year and then stopped it in November 2024. He previously took testosterone  in high school for 3 years. He has 2 healthy children. Testosterone  was 42 and 53 in 2023 prior to starting IM testosterone . He has fair energy and he has gained 50-60lbs since stopping the testosterone . No issues with erectile dysfunction.    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 If you have any questions, ask your nurse or doctor.          amphetamine-dextroamphetamine 20 MG tablet Commonly known as: ADDERALL Take 20 mg by mouth 2 (two) times daily. Pt said that he is taking 30 mg twice a day   atorvastatin  20 MG tablet Commonly known as: LIPITOR Take 1 tablet (20 mg total) by mouth daily.   cloNIDine 0.1 MG tablet Commonly known as: CATAPRES Take 0.1 mg by mouth daily.   losartan -hydrochlorothiazide 50-12.5 MG tablet Commonly known as: HYZAAR Take 1 tablet by mouth daily with breakfast.   meloxicam 15 MG tablet Commonly known as: MOBIC Take 15 mg by mouth daily as needed.   sertraline  50 MG tablet Commonly known as: ZOLOFT  Take 1 tablet (50 mg total) by mouth daily.   SYRINGE-NEEDLE (DISP) 3 ML 21G X 1-1/2 3 ML Misc Use to inject testosterone  every week   testosterone  cypionate  100 MG/ML injection Commonly known as: DEPOTESTOTERONE CYPIONATE INJECT 1 MLS (100 MG) INTO THE MUSCLE EVERY 10 DAYS.        Allergies:  Allergies  Allergen Reactions   Codeine Other (See Comments)     HYDRO- Codone caused the  chest pain    Family History: Family History  Problem Relation Age of Onset   Gallbladder disease Mother    Gallstones Maternal Grandmother     Social History:  reports that he has never smoked. He quit smokeless tobacco use about 5 years ago.  His smokeless tobacco use included snuff. He reports that he does not currently use alcohol. He reports that he does not use drugs.  ROS: All other review of systems were reviewed and are negative except what is noted above in HPI  Physical Exam: BP (!) 155/99   Pulse 76   Constitutional:  Alert and oriented, No acute distress. HEENT: [place] AT, moist mucus membranes.  Trachea midline, no masses. Cardiovascular: No clubbing, cyanosis, or edema. Respiratory: Normal respiratory effort, no increased work of breathing. GI: Abdomen is soft, nontender, nondistended, no abdominal masses GU: No CVA tenderness.  Lymph: No cervical or inguinal lymphadenopathy. Skin: No rashes, bruises or suspicious lesions. Neurologic: Grossly intact, no focal deficits, moving all 4 extremities. Psychiatric: Normal mood and affect.  Laboratory Data: Lab Results  Component Value Date   WBC 6.0 09/13/2022   HGB 14.1 09/13/2022   HCT 44.5 09/13/2022   MCV 83 09/13/2022   PLT 301 09/13/2022    Lab Results  Component Value Date   CREATININE 0.89 07/28/2023    No results found for: PSA  Lab Results  Component Value Date   TESTOSTERONE  487 07/28/2023    Lab Results  Component Value Date   HGBA1C 5.6 08/06/2023    Urinalysis    Component Value Date/Time   COLORURINE YELLOW 11/20/2019 1024   APPEARANCEUR CLEAR 11/20/2019 1024   LABSPEC 1.020 11/20/2019 1024   PHURINE 7.0 11/20/2019 1024   GLUCOSEU NEGATIVE  11/20/2019 1024   HGBUR NEGATIVE 11/20/2019 1024   BILIRUBINUR NEGATIVE 11/20/2019 1024   KETONESUR NEGATIVE 11/20/2019 1024   PROTEINUR NEGATIVE 11/20/2019 1024   UROBILINOGEN 0.2 01/12/2013 1625   NITRITE NEGATIVE 11/20/2019 1024   LEUKOCYTESUR NEGATIVE 11/20/2019 1024    No results found for: LABMICR, WBCUA, RBCUA, LABEPIT, MUCUS, BACTERIA  Pertinent Imaging: No results found for this or any previous visit.  No results found for this or any previous visit.  No results found for this or any previous visit.  No results found for this or any previous visit.  No results found for this or any previous visit.  No results found for this or any previous visit.  No results found for this or any previous visit.  No results found for this or any previous visit.   Assessment & Plan:    1. Hypogonadism male (Primary) -Testosterone  labs today, will call with results. We will likely start clomid 25mg  daily pending testosterone  labs. Followup 3 months with testosterone  labs    No follow-ups on file.  Belvie Clara, MD  [facility] 

## 2024-06-21 NOTE — Patient Instructions (Signed)
 Hypogonadism, Male  Male hypogonadism is a condition of having a level of testosterone  that is lower than normal. Testosterone  is a chemical, or hormone, that is made mainly in the testicles. In boys, testosterone  is responsible for the development of male characteristics during puberty. These include: Making the penis bigger. Growing and building the muscles. Growing facial hair. Deepening the voice. In adult men, testosterone  is responsible for maintaining: An interest in sex and the ability to have sex. Muscle mass. Sperm production. Red blood cell production. Bone strength. Testosterone  also gives men energy and a sense of well-being. Testosterone  normally decreases as men age and the testicles make less testosterone . Testosterone  levels can vary from man to man. Not all men will have signs and symptoms of low testosterone . Weight, alcohol use, medicines, and certain medical conditions can affect a man's testosterone  level. What are the causes? This condition is caused by: A natural decrease in testosterone  that occurs as a man grows older. This is the main cause of this condition. Use of medicines, such as antidepressants, steroids, and opioids. Diseases and conditions that affect the testicles or the making of testosterone . These include: Injury or damage to the testicles from trauma, cancer, cancer treatment, or infection. Diabetes. Sleep apnea. Genetic conditions that men are born with. Disease of the pituitary gland. This gland is in the brain. It produces hormones. Obesity. Metabolic syndrome. This is a group of diseases that affect blood pressure, blood sugar, cholesterol, and belly fat. HIV or AIDS. Alcohol abuse. Kidney failure. Other long-term or chronic diseases. What are the signs or symptoms? Common symptoms of this condition include: Loss of interest in sex (low sex drive). Inability to have or maintain an erection (erectile dysfunction). Feeling tired  (fatigue). Mood changes, like irritability or depression. Loss of muscle and body hair. Infertility. Large breasts. Weight gain (obesity). How is this diagnosed? Your health care provider can diagnose hypogonadism based on: Your signs and symptoms. A physical exam to check your testosterone  levels. This includes blood tests. Testosterone  levels can change throughout the day. Levels are highest in the morning. You may need to have repeat blood tests before getting a diagnosis of hypogonadism. Depending on your medical history and test results, your health care provider may also do other tests to find the cause of low testosterone . How is this treated? This condition is treated with testosterone  replacement therapy. Testosterone  can be given by: Injection or through pellets inserted under the skin. Gels or patches placed on the skin or in the mouth. Testosterone  therapy is not for everyone. It has risks and side effects. Your health care provider will consider your medical history, your risk for prostate cancer, your age, and your symptoms before putting you on testosterone  replacement therapy. Follow these instructions at home: Take over-the-counter and prescription medicines only as told by your health care provider. Eat foods that are high in fiber, such as beans, whole grains, and fresh fruits and vegetables. Limit foods that are high in fat and processed sugars, such as fried or sweet foods. If you drink alcohol: Limit how much you have to 0-2 drinks a day. Know how much alcohol is in your drink. In the [place] one drink equals one 12 oz bottle of beer (355 mL), one 5 oz glass of wine (148 mL), or one 1 oz glass of hard liquor (44 mL). Return to your normal activities as told by your health care provider. Ask your health care provider what activities are safe for you. Keep all  follow-up visits. This is important. Contact a health care provider if: You have any of the signs or symptoms of  low testosterone . You have any side effects from testosterone  therapy. Summary Male hypogonadism is a condition of having a level of testosterone  that is lower than normal. The natural drop in testosterone  production that occurs with age is the most common cause of this condition. Low testosterone  can also be caused by many diseases and conditions that affect the testicles and the making of testosterone . This condition is treated with testosterone  replacement therapy. There are risks and side effects of testosterone  therapy. Your health care provider will consider your age, medical history, symptoms, and risks for prostate cancer before putting you on testosterone  therapy. This information is not intended to replace advice given to you by your health care provider. Make sure you discuss any questions you have with your health care provider. Document Revised: 09/22/2023 Document Reviewed: 09/22/2023 Elsevier Patient Education  2025 [employer].

## 2024-06-26 LAB — COMPREHENSIVE METABOLIC PANEL WITH GFR
ALT: 87 IU/L — ABNORMAL HIGH (ref 0–44)
AST: 44 IU/L — ABNORMAL HIGH (ref 0–40)
Albumin: 4.6 g/dL (ref 4.3–5.2)
Alkaline Phosphatase: 56 IU/L (ref 44–121)
BUN/Creatinine Ratio: 12 (ref 9–20)
BUN: 10 mg/dL (ref 6–20)
Bilirubin Total: 1 mg/dL (ref 0.0–1.2)
CO2: 23 mmol/L (ref 20–29)
Calcium: 9.5 mg/dL (ref 8.7–10.2)
Chloride: 101 mmol/L (ref 96–106)
Creatinine, Ser: 0.84 mg/dL (ref 0.76–1.27)
Globulin, Total: 3 g/dL (ref 1.5–4.5)
Glucose: 85 mg/dL (ref 70–99)
Potassium: 4.8 mmol/L (ref 3.5–5.2)
Sodium: 137 mmol/L (ref 134–144)
Total Protein: 7.6 g/dL (ref 6.0–8.5)
eGFR: 120 mL/min/1.73 (ref >59)

## 2024-06-26 LAB — CBC
Hematocrit: 49.6 % (ref 37.5–51.0)
Hemoglobin: 16 g/dL (ref 13.0–17.7)
MCH: 27.6 pg (ref 26.6–33.0)
MCHC: 32.3 g/dL (ref 31.5–35.7)
MCV: 86 fL (ref 79–97)
Platelets: 284 x10E3/uL (ref 150–450)
RBC: 5.79 x10E6/uL (ref 4.14–5.80)
RDW: 14.4 % (ref 11.6–15.4)
WBC: 8.7 x10E3/uL (ref 3.4–10.8)

## 2024-06-26 LAB — TESTOSTERONE,FREE AND TOTAL
Testosterone, Free: 12.4 pg/mL (ref 8.7–25.1)
Testosterone: 341 ng/dL (ref 264–916)

## 2024-06-26 LAB — ESTRADIOL: Estradiol: 27.9 pg/mL (ref 7.6–42.6)

## 2024-06-26 LAB — FSH/LH
FSH: <0.3 m[IU]/mL — ABNORMAL LOW (ref 1.5–12.4)
LH: <0.3 m[IU]/mL — ABNORMAL LOW (ref 1.7–8.6)

## 2024-06-29 ENCOUNTER — Ambulatory Visit: Payer: Self-pay | Admitting: Urology

## 2024-06-29 ENCOUNTER — Other Ambulatory Visit: Payer: Self-pay | Admitting: Urology

## 2024-06-29 MED ORDER — CLOMIPHENE CITRATE 50 MG PO TABS: 25.0000 mg | ORAL_TABLET | Freq: Every day | ORAL | 5 refills | Status: DC

## 2024-07-20 ENCOUNTER — Other Ambulatory Visit: Payer: Self-pay | Admitting: Family Medicine

## 2024-07-20 DIAGNOSIS — R160 Hepatomegaly, not elsewhere classified: Secondary | ICD-10-CM

## 2024-07-21 ENCOUNTER — Encounter: Payer: Self-pay | Admitting: Gastroenterology

## 2024-07-23 ENCOUNTER — Other Ambulatory Visit

## 2024-08-27 ENCOUNTER — Ambulatory Visit: Admitting: Gastroenterology

## 2024-10-01 ENCOUNTER — Encounter: Payer: Self-pay | Admitting: Gastroenterology

## 2024-10-01 ENCOUNTER — Ambulatory Visit: Admitting: Gastroenterology

## 2024-10-01 ENCOUNTER — Other Ambulatory Visit

## 2024-10-01 VITALS — BP 148/110 | HR 80 | Temp 98.4°F | Ht 72.0 in | Wt 292.8 lb

## 2024-10-01 DIAGNOSIS — R7401 Elevation of levels of liver transaminase levels: Secondary | ICD-10-CM

## 2024-10-01 DIAGNOSIS — K760 Fatty (change of) liver, not elsewhere classified: Secondary | ICD-10-CM

## 2024-10-01 DIAGNOSIS — E291 Testicular hypofunction: Secondary | ICD-10-CM

## 2024-10-01 DIAGNOSIS — R162 Hepatomegaly with splenomegaly, not elsewhere classified: Secondary | ICD-10-CM

## 2024-10-01 DIAGNOSIS — R7989 Other specified abnormal findings of blood chemistry: Secondary | ICD-10-CM | POA: Insufficient documentation

## 2024-10-01 NOTE — Patient Instructions (Addendum)
 Complete labs at [facility] at your convenience. We will be in touch with results and recommendations.   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   First line treatment is risk factor reductions.  -manage your blood pressure, take your medication and reduce salt intake -manage your cholesterol, eat low fat foods -manage your weight, goal of 10% weight reduction. Georjean is a great option for weight management and fatty liver management. -reduce sweets -advise against alcohol consumption given you have enlarged liver  -exercise regularly, goal of at least 150 minutes per week  We will continue to follow you, at minimum next visit in six months but could be sooner based on lab results.

## 2024-10-01 NOTE — Progress Notes (Signed)
 GI Office Note    Referring Provider: Dow Longs, PA-C Primary Care Physician:  Dow Longs, PA-C  Primary Gastroenterologist: previously Dr. Golda  Chief Complaint   Chief Complaint  Patient presents with   Elevated Hepatic Enzymes    Had u/s done at [facility]     History of Present Illness   Ryan Carey is a 28 y.o. male presenting today at the request of Longs Dow, PA-C for elevated LFTs, hepatosplenomegaly.  Discussed the use of AI scribe software for clinical note transcription with the patient, who gave verbal consent to proceed.  History of Present Illness Ryan Carey is a 28 year old male with elevated liver enzymes and fatty liver who presents for evaluation.   He has a history of elevated liver enzymes and fatty liver. Several years ago, he experienced pain and was told there was a mass on his liver, later identified as a fatty mass. His liver enzymes normalized after gallbladder removal but over the years became elevated again. An ultrasound in September showed hepatosplenomegaly. A subsequent CT scan in October indicated a normal spleen.   He experiences right upper quadrant pain, particularly when hungry, which can be severe enough to induce vomiting. The pain is described as stabbing and is relieved by eating. No burning sensation, heartburn, or indigestion, except occasionally after eating homemade tacos.  He has regular bowel movements, two to three times a day, with no issues of constipation or diarrhea. No blood in the stool, black stools, or generalized itching. Stools may be a little more frequent since his gallbladder was removed.  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 He has a history of testosterone  injections and oral steroid use for muscle building (10+ years ago). He drinks alcohol socially, about once or twice a year, consuming up to six beers at a time. Really  denies any history of regular use. I do note he was seen in the ED 04/2022 with alcohol intoxication (drinking at a party, friend driving him home took him to ED after patient vomited and had syncopal episode). Alcohol level 230.  He has tattoos that were professionally placed.  He has a history of being over 300 pounds and is currently trying to lose weight. Notes that when he drops weight his liver enzymes improve. He was prescribed Wegovy recently to aid in weight loss. He was previously on cholesterol medication due to elevated levels from testosterone  use but is not currently taking any since off testosterone .   He has a history of high blood pressure, initially attributed to pain from his gallbladder. He is currently on losartan /hctz, taken at night due to its drowsy side effects. His blood pressure varies, sometimes normal at home, but elevated in medical settings.  Family history includes his mother having gallbladder disease and thyroid issues. He is unaware of any family history of liver disease or colon cancer. He does not know his father's history.      Prior Data   Results        Latest Ref Rng & Units 06/21/2024    9:34 AM 07/28/2023    8:23 AM 07/05/2022   12:00 AM  Hepatic Function  Total Protein 6.0 - 8.5 g/dL 7.6  7.4    Albumin 4.3 - 5.2 g/dL 4.6  4.6    AST 0 - 40 IU/L 44  35  57      ALT 0 - 44  IU/L 87  58  96      Alk Phosphatase 44 - 121 IU/L 56  56    Total Bilirubin 0.0 - 1.2 mg/dL 1.0  1.3       This result is from an external source.     June 21, 2024: Creatinine 0.84, sodium 137, albumin 4.6, total bilirubin 1, alk phos 56, AST 44, ALT 87, white blood cell count 8.7, hemoglobin 16, platelets 284.  May 2025: Total cholesterol 185, LDL 103.2  11/18/19--CBC normal, AST 42, ALT 109, alk phos and bili normal.  Lipase normal.  Urinalysis negative    2019--LFTs elevated with ALT 82, AST 42.  Work-up including ceruloplasmin, ferritin, ANA, hepatitis C antibody,  hepatitis B surface antigen, iron studies and thyroid were unremarkable.  TTG IgA less than 2.   2018 LFTS (ER admission-diarrhea, abd pain) -T bili 1.3, ALT 132, AST 55  CT abdomen with contrast July 29, 2024 [place] [facility]: Liver appears enlarged and hypodense.  No discrete liver mass.  Suspected hepatic steatosis. Spleen normal.  Abdominal ultrasound 07/16/2024: Hepatosplenomegaly. Liver measuring 18cm, spleen 13 cm. Diffuse fatty change within the liver parenchyma.  Right upper quadrant ultrasound January 2021: Negative for cholelithiasis Gallbladder polyp measuring 4 mm.  Recommend follow-up ultrasound in 12 months. Liver steatosis.  Medications   Current Outpatient Medications  Medication Sig Dispense Refill   amphetamine-dextroamphetamine (ADDERALL) 30 MG tablet Take 1 tablet by mouth 2 (two) times daily.     clomiPHENE  (CLOMID ) 50 MG tablet Take 0.5 tablets (25 mg total) by mouth daily. 15 tablet 5   losartan -hydrochlorothiazide (HYZAAR) 50-12.5 MG tablet Take 1 tablet by mouth daily with breakfast. 90 tablet 1   traZODone (DESYREL) 50 MG tablet Take 50 mg by mouth at bedtime as needed.     WEGOVY 0.25 MG/0.5ML SOAJ SQ injection Inject 0.25 mg into the skin once a week.     No current facility-administered medications for this visit.    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   Chronic headaches    Enuresis    Essential hypertension, benign 05/06/2023   Mixed hyperlipidemia    OSA (obstructive sleep apnea)    Scalp psoriasis     Past Surgical History   Past Surgical History:  Procedure Laterality Date   CHOLECYSTECTOMY  2021   HAND SURGERY Right 01/2023    Past Family History   Family History  Problem Relation Age of Onset    Gallbladder disease Mother    Thyroid disease Mother    Gallstones Maternal Grandmother    Colon cancer Neg Hx    Liver disease Neg Hx     Past Social History   Social History   Socioeconomic History   Marital status: Married    Spouse name: Not on file   Number of children: Not on file   Years of education: Not on file   Highest education level: Not on file  Occupational History   Not on file  Tobacco Use   Smoking status: Never   Smokeless tobacco: Former    Types: Snuff    Quit date: 11/28/2018  Vaping Use   Vaping status: Never Used  Substance and Sexual Activity   Alcohol use: Not Currently  Comment: history of etoh use socially couple times per year, 5-6 beers at a time. no daily use.   Drug use: No   Sexual activity: Not on file  Other Topics Concern   Not on file  Social History Narrative   Not on file   Social Drivers of Health   Financial Resource Strain: Not on file  Food Insecurity: Not on file  Transportation Needs: Not on file  Physical Activity: Not on file  Stress: Not on file  Social Connections: Not on file  Intimate Partner Violence: Not on file    Review of Systems   General: Negative for anorexia, weight loss, fever, chills, fatigue, weakness. Eyes: Negative for vision changes.  ENT: Negative for hoarseness, difficulty swallowing , nasal congestion. CV: Negative for chest pain, angina, palpitations, dyspnea on exertion, peripheral edema.  Respiratory: Negative for dyspnea at rest, dyspnea on exertion, cough, sputum, wheezing.  GI: See history of present illness. GU:  Negative for dysuria, hematuria, urinary incontinence, urinary frequency, nocturnal urination.  MS: Negative for joint pain, low back pain.  Derm: Negative for rash or itching.  Neuro: Negative for weakness, abnormal sensation, seizure, frequent headaches, memory loss,  confusion.  Psych: Negative for anxiety, depression, suicidal ideation, hallucinations.  Endo:  Negative for unusual weight change.  Heme: Negative for bruising or bleeding. Allergy: Negative for rash or hives.  Physical Exam   BP (!) 148/110   Pulse 80   Temp 98.4 F (36.9 C) (Oral)   Ht 6' (1.829 m)   Wt 292 lb 12.8 oz (132.8 kg)   SpO2 97%   BMI 39.71 kg/m    General: Well-nourished, well-developed in no acute distress.  Head: Normocephalic, atraumatic.   Eyes: Conjunctiva pink, no icterus. Mouth: Oropharyngeal mucosa moist and pink  Neck: Supple without thyromegaly, masses, or lymphadenopathy.  Lungs: Clear to auscultation bilaterally.  Heart: Regular rate and rhythm, no murmurs rubs or gallops.  Abdomen: Bowel sounds are normal, nontender, nondistended, no hepatosplenomegaly or masses,  no abdominal bruits or hernia, no rebound or guarding.   Rectal: not performed Extremities: No lower extremity edema. No clubbing or deformities.  Neuro: Alert and oriented x 4 , grossly normal neurologically.  Skin: Warm and dry, no rash or jaundice.   Psych: Alert and cooperative, normal mood and affect.  Labs   See above  Imaging Studies   No results found.  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 Risk factors include HTN, obesity, dyslipidemia.   Low hepatitis B and C risk. Weight loss advised to improve liver health. -Liver serologies planned.  -Advised weight loss through lifestyle modifications and Wegovy. Initial goal of 10% weight reduction. -Recommend daily exercise, minimum of 150 minutes per week. -Low fat/low carb diet. -Advised alcohol avoidance. -Continue to monitor cholesterol with PCP -Agree with Tzhncb for weight management and fatty liver.  -Consider Fibroscan in the next few months, accuracy improves with decreased BMI and currently he is right at threshold of qualifying for Fibroscan.  -Return in six months although this may be adjusted based on  lab results.                   Sonny RAMAN. Ezzard, MHS, PA-C [facility]

## 2024-10-03 LAB — CBC
Hematocrit: 51.9 % — ABNORMAL HIGH (ref 37.5–51.0)
Hemoglobin: 16.7 g/dL (ref 13.0–17.7)
MCH: 26.7 pg (ref 26.6–33.0)
MCHC: 32.2 g/dL (ref 31.5–35.7)
MCV: 83 fL (ref 79–97)
Platelets: 242 x10E3/uL (ref 150–450)
RBC: 6.25 x10E6/uL — ABNORMAL HIGH (ref 4.14–5.80)
RDW: 16.1 % — ABNORMAL HIGH (ref 11.6–15.4)
WBC: 7.3 x10E3/uL (ref 3.4–10.8)

## 2024-10-03 LAB — COMPREHENSIVE METABOLIC PANEL WITH GFR
ALT: 66 IU/L — ABNORMAL HIGH (ref 0–44)
AST: 34 IU/L (ref 0–40)
Albumin: 4.6 g/dL (ref 4.3–5.2)
Alkaline Phosphatase: 57 IU/L (ref 47–123)
BUN/Creatinine Ratio: 17 (ref 9–20)
BUN: 15 mg/dL (ref 6–20)
Bilirubin Total: 1.3 mg/dL — ABNORMAL HIGH (ref 0.0–1.2)
CO2: 24 mmol/L (ref 20–29)
Calcium: 9.8 mg/dL (ref 8.7–10.2)
Chloride: 101 mmol/L (ref 96–106)
Creatinine, Ser: 0.9 mg/dL (ref 0.76–1.27)
Globulin, Total: 2.9 g/dL (ref 1.5–4.5)
Glucose: 92 mg/dL (ref 70–99)
Potassium: 4.7 mmol/L (ref 3.5–5.2)
Sodium: 139 mmol/L (ref 134–144)
Total Protein: 7.5 g/dL (ref 6.0–8.5)
eGFR: 118 mL/min/1.73 (ref >59)

## 2024-10-03 LAB — TESTOSTERONE,FREE AND TOTAL
Testosterone, Free: 10.9 pg/mL (ref 8.7–25.1)
Testosterone: 207 ng/dL — ABNORMAL LOW (ref 264–916)

## 2024-10-03 LAB — ESTRADIOL: Estradiol: 20.5 pg/mL (ref 7.6–42.6)

## 2024-10-06 ENCOUNTER — Ambulatory Visit: Admitting: Urology

## 2024-10-06 VITALS — BP 113/79 | HR 97

## 2024-10-06 DIAGNOSIS — Z79890 Hormone replacement therapy (postmenopausal): Secondary | ICD-10-CM | POA: Diagnosis not present

## 2024-10-06 DIAGNOSIS — E291 Testicular hypofunction: Secondary | ICD-10-CM | POA: Diagnosis not present

## 2024-10-06 MED ORDER — CLOMIPHENE CITRATE 50 MG PO TABS: 50.0000 mg | ORAL_TABLET | Freq: Every day | ORAL | 5 refills | Status: AC

## 2024-10-06 NOTE — Progress Notes (Unsigned)
 10/06/2024 8:41 AM   Ryan Carey 1995/12/06 [id number]  Referring provider: Trudy Vaughn FALCON, MD [address] 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 If you have any questions, ask your nurse or doctor.          amphetamine-dextroamphetamine 30 MG tablet Commonly known as: ADDERALL Take 1 tablet by mouth 2 (two) times daily.   clomiPHENE  50 MG tablet Commonly known as: CLOMID  Take 0.5 tablets (25 mg total) by mouth daily.   losartan -hydrochlorothiazide 50-12.5 MG tablet Commonly known as: HYZAAR Take 1 tablet by mouth daily with breakfast.   traZODone 50 MG tablet Commonly known as: DESYREL Take 50 mg by mouth at bedtime as needed.   Wegovy 0.25 MG/0.5ML Soaj SQ injection Generic drug: semaglutide-weight management Inject 0.25 mg into the skin once a week.        Allergies:  Allergies  Allergen Reactions   Codeine Other (See Comments)     HYDRO- Codone caused the  chest pain    Family History: Family History  Problem Relation Age of Onset   Gallbladder disease Mother    Thyroid disease Mother    Gallstones Maternal Grandmother    Colon cancer Neg Hx    Liver disease Neg Hx     Social History:  reports that he has never smoked. He quit smokeless tobacco use about 5 years ago.  His  smokeless tobacco use included snuff. He reports that he does not currently use alcohol. He reports that he does not use drugs.  ROS: All other review of systems were reviewed and are negative except what is noted above in HPI  Physical Exam: BP 113/79   Pulse 97   Constitutional:  Alert and oriented, No acute distress. HEENT: [place] AT, moist mucus membranes.  Trachea midline, no masses. Cardiovascular: No clubbing, cyanosis, or edema. Respiratory: Normal respiratory effort, no increased work of breathing. GI: Abdomen is soft, nontender, nondistended, no abdominal masses GU: No CVA tenderness.  Lymph: No cervical or inguinal lymphadenopathy. Skin: No rashes, bruises or suspicious lesions. Neurologic: Grossly intact, no focal deficits, moving all 4 extremities. Psychiatric: Normal mood and affect.  Laboratory Data: Lab Results  Component Value Date   WBC 7.3 10/01/2024   HGB 16.7 10/01/2024   HCT 51.9 (H) 10/01/2024   MCV 83 10/01/2024   PLT 242 10/01/2024    Lab Results  Component Value Date   CREATININE 0.90 10/01/2024    No results found for: PSA  Lab Results  Component Value Date   TESTOSTERONE  207 (L) 10/01/2024    Lab Results  Component Value Date   HGBA1C 5.6 08/06/2023    Urinalysis  Component Value Date/Time   COLORURINE YELLOW 11/20/2019 1024   APPEARANCEUR CLEAR 11/20/2019 1024   LABSPEC 1.020 11/20/2019 1024   PHURINE 7.0 11/20/2019 1024   GLUCOSEU NEGATIVE 11/20/2019 1024   HGBUR NEGATIVE 11/20/2019 1024   BILIRUBINUR NEGATIVE 11/20/2019 1024   KETONESUR NEGATIVE 11/20/2019 1024   PROTEINUR NEGATIVE 11/20/2019 1024   UROBILINOGEN 0.2 01/12/2013 1625   NITRITE NEGATIVE 11/20/2019 1024   LEUKOCYTESUR NEGATIVE 11/20/2019 1024    No results found for: LABMICR, WBCUA, RBCUA, LABEPIT, MUCUS, BACTERIA  Pertinent Imaging: *** No results found for this or any previous visit.  No results found for this or any previous visit.  No  results found for this or any previous visit.  No results found for this or any previous visit.  No results found for this or any previous visit.  No results found for this or any previous visit.  No results found for this or any previous visit.  No results found for this or any previous visit.   Assessment & Plan:    1. 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  Belvie Clara, MD  [facility]

## 2024-10-19 ENCOUNTER — Encounter: Payer: Self-pay | Admitting: Urology

## 2024-10-19 NOTE — Patient Instructions (Signed)
 Hypogonadism, Male  Male hypogonadism is a condition of having a level of testosterone  that is lower than normal. Testosterone  is a chemical, or hormone, that is made mainly in the testicles. In boys, testosterone  is responsible for the development of male characteristics during puberty. These include: Making the penis bigger. Growing and building the muscles. Growing facial hair. Deepening the voice. In adult men, testosterone  is responsible for maintaining: An interest in sex and the ability to have sex. Muscle mass. Sperm production. Red blood cell production. Bone strength. Testosterone  also gives men energy and a sense of well-being. Testosterone  normally decreases as men age and the testicles make less testosterone . Testosterone  levels can vary from man to man. Not all men will have signs and symptoms of low testosterone . Weight, alcohol use, medicines, and certain medical conditions can affect a man's testosterone  level. What are the causes? This condition is caused by: A natural decrease in testosterone  that occurs as a man grows older. This is the main cause of this condition. Use of medicines, such as antidepressants, steroids, and opioids. Diseases and conditions that affect the testicles or the making of testosterone . These include: Injury or damage to the testicles from trauma, cancer, cancer treatment, or infection. Diabetes. Sleep apnea. Genetic conditions that men are born with. Disease of the pituitary gland. This gland is in the brain. It produces hormones. Obesity. Metabolic syndrome. This is a group of diseases that affect blood pressure, blood sugar, cholesterol, and belly fat. HIV or AIDS. Alcohol abuse. Kidney failure. Other long-term or chronic diseases. What are the signs or symptoms? Common symptoms of this condition include: Loss of interest in sex (low sex drive). Inability to have or maintain an erection (erectile dysfunction). Feeling tired  (fatigue). Mood changes, like irritability or depression. Loss of muscle and body hair. Infertility. Large breasts. Weight gain (obesity). How is this diagnosed? Your health care provider can diagnose hypogonadism based on: Your signs and symptoms. A physical exam to check your testosterone  levels. This includes blood tests. Testosterone  levels can change throughout the day. Levels are highest in the morning. You may need to have repeat blood tests before getting a diagnosis of hypogonadism. Depending on your medical history and test results, your health care provider may also do other tests to find the cause of low testosterone . How is this treated? This condition is treated with testosterone  replacement therapy. Testosterone  can be given by: Injection or through pellets inserted under the skin. Gels or patches placed on the skin or in the mouth. Testosterone  therapy is not for everyone. It has risks and side effects. Your health care provider will consider your medical history, your risk for prostate cancer, your age, and your symptoms before putting you on testosterone  replacement therapy. Follow these instructions at home: Take over-the-counter and prescription medicines only as told by your health care provider. Eat foods that are high in fiber, such as beans, whole grains, and fresh fruits and vegetables. Limit foods that are high in fat and processed sugars, such as fried or sweet foods. If you drink alcohol: Limit how much you have to 0-2 drinks a day. Know how much alcohol is in your drink. In the [place] one drink equals one 12 oz bottle of beer (355 mL), one 5 oz glass of wine (148 mL), or one 1 oz glass of hard liquor (44 mL). Return to your normal activities as told by your health care provider. Ask your health care provider what activities are safe for you. Keep all  follow-up visits. This is important. Contact a health care provider if: You have any of the signs or symptoms of  low testosterone . You have any side effects from testosterone  therapy. Summary Male hypogonadism is a condition of having a level of testosterone  that is lower than normal. The natural drop in testosterone  production that occurs with age is the most common cause of this condition. Low testosterone  can also be caused by many diseases and conditions that affect the testicles and the making of testosterone . This condition is treated with testosterone  replacement therapy. There are risks and side effects of testosterone  therapy. Your health care provider will consider your age, medical history, symptoms, and risks for prostate cancer before putting you on testosterone  therapy. This information is not intended to replace advice given to you by your health care provider. Make sure you discuss any questions you have with your health care provider. Document Revised: 09/22/2023 Document Reviewed: 09/22/2023 Elsevier Patient Education  2025 [employer].

## 2025-03-14 ENCOUNTER — Other Ambulatory Visit

## 2025-03-30 ENCOUNTER — Ambulatory Visit: Admitting: Urology
